# Patient Record
Sex: Female | Born: 1935 | Race: White | Hispanic: No | Smoking: Former smoker
Health system: Southern US, Community
[De-identification: ages and names within clinical notes are randomized; demographics above are authoritative.]

## PROBLEM LIST (undated history)

## (undated) DIAGNOSIS — R7302 Impaired glucose tolerance (oral): Secondary | ICD-10-CM

## (undated) DIAGNOSIS — I5030 Unspecified diastolic (congestive) heart failure: Secondary | ICD-10-CM

## (undated) DIAGNOSIS — F39 Unspecified mood [affective] disorder: Secondary | ICD-10-CM

## (undated) DIAGNOSIS — M1990 Unspecified osteoarthritis, unspecified site: Secondary | ICD-10-CM

## (undated) DIAGNOSIS — R259 Unspecified abnormal involuntary movements: Secondary | ICD-10-CM

## (undated) DIAGNOSIS — I2721 Secondary pulmonary arterial hypertension: Secondary | ICD-10-CM

## (undated) DIAGNOSIS — E785 Hyperlipidemia, unspecified: Secondary | ICD-10-CM

## (undated) DIAGNOSIS — I10 Essential (primary) hypertension: Secondary | ICD-10-CM

## (undated) DIAGNOSIS — J449 Chronic obstructive pulmonary disease, unspecified: Secondary | ICD-10-CM

## (undated) DIAGNOSIS — E039 Hypothyroidism, unspecified: Secondary | ICD-10-CM

## (undated) DIAGNOSIS — G252 Other specified forms of tremor: Secondary | ICD-10-CM

## (undated) DIAGNOSIS — M2550 Pain in unspecified joint: Secondary | ICD-10-CM

## (undated) HISTORY — PX: TONSILLECTOMY: SUR1361

---

## 2012-01-14 DIAGNOSIS — Z79899 Other long term (current) drug therapy: Secondary | ICD-10-CM | POA: Insufficient documentation

## 2012-01-14 DIAGNOSIS — Z0000 Encounter for general adult medical examination without abnormal findings: Secondary | ICD-10-CM | POA: Insufficient documentation

## 2012-01-14 DIAGNOSIS — R350 Frequency of micturition: Secondary | ICD-10-CM | POA: Insufficient documentation

## 2016-03-25 DIAGNOSIS — M170 Bilateral primary osteoarthritis of knee: Secondary | ICD-10-CM | POA: Insufficient documentation

## 2016-07-30 DIAGNOSIS — J9691 Respiratory failure, unspecified with hypoxia: Secondary | ICD-10-CM | POA: Insufficient documentation

## 2017-02-04 DIAGNOSIS — I509 Heart failure, unspecified: Secondary | ICD-10-CM | POA: Insufficient documentation

## 2017-02-04 DIAGNOSIS — M05741 Rheumatoid arthritis with rheumatoid factor of right hand without organ or systems involvement: Secondary | ICD-10-CM | POA: Insufficient documentation

## 2017-02-04 DIAGNOSIS — R0602 Shortness of breath: Secondary | ICD-10-CM | POA: Insufficient documentation

## 2017-02-04 DIAGNOSIS — M05742 Rheumatoid arthritis with rheumatoid factor of left hand without organ or systems involvement: Secondary | ICD-10-CM

## 2017-10-22 ENCOUNTER — Encounter: Payer: Self-pay | Admitting: Emergency Medicine

## 2017-10-22 ENCOUNTER — Emergency Department: Payer: Medicare Other

## 2017-10-22 ENCOUNTER — Inpatient Hospital Stay
Admission: EM | Admit: 2017-10-22 | Discharge: 2017-10-27 | DRG: 480 | Disposition: A | Discharge status: Skilled Nursing Facility | Payer: Medicare Other | Attending: Internal Medicine | Admitting: Internal Medicine

## 2017-10-22 DIAGNOSIS — I110 Hypertensive heart disease with heart failure: Secondary | ICD-10-CM | POA: Diagnosis present

## 2017-10-22 DIAGNOSIS — E860 Dehydration: Secondary | ICD-10-CM | POA: Diagnosis present

## 2017-10-22 DIAGNOSIS — E039 Hypothyroidism, unspecified: Secondary | ICD-10-CM | POA: Diagnosis present

## 2017-10-22 DIAGNOSIS — E785 Hyperlipidemia, unspecified: Secondary | ICD-10-CM | POA: Diagnosis present

## 2017-10-22 DIAGNOSIS — I5032 Chronic diastolic (congestive) heart failure: Secondary | ICD-10-CM | POA: Diagnosis present

## 2017-10-22 DIAGNOSIS — S72142A Displaced intertrochanteric fracture of left femur, initial encounter for closed fracture: Secondary | ICD-10-CM | POA: Diagnosis not present

## 2017-10-22 DIAGNOSIS — J9621 Acute and chronic respiratory failure with hypoxia: Secondary | ICD-10-CM | POA: Diagnosis present

## 2017-10-22 DIAGNOSIS — Z79890 Hormone replacement therapy (postmenopausal): Secondary | ICD-10-CM

## 2017-10-22 DIAGNOSIS — E43 Unspecified severe protein-calorie malnutrition: Secondary | ICD-10-CM

## 2017-10-22 DIAGNOSIS — R5381 Other malaise: Secondary | ICD-10-CM | POA: Diagnosis present

## 2017-10-22 DIAGNOSIS — K219 Gastro-esophageal reflux disease without esophagitis: Secondary | ICD-10-CM | POA: Diagnosis present

## 2017-10-22 DIAGNOSIS — Z87891 Personal history of nicotine dependence: Secondary | ICD-10-CM

## 2017-10-22 DIAGNOSIS — I2721 Secondary pulmonary arterial hypertension: Secondary | ICD-10-CM | POA: Diagnosis present

## 2017-10-22 DIAGNOSIS — Z791 Long term (current) use of non-steroidal anti-inflammatories (NSAID): Secondary | ICD-10-CM

## 2017-10-22 DIAGNOSIS — Z885 Allergy status to narcotic agent status: Secondary | ICD-10-CM

## 2017-10-22 DIAGNOSIS — S72002A Fracture of unspecified part of neck of left femur, initial encounter for closed fracture: Secondary | ICD-10-CM

## 2017-10-22 DIAGNOSIS — K5900 Constipation, unspecified: Secondary | ICD-10-CM | POA: Diagnosis present

## 2017-10-22 DIAGNOSIS — E876 Hypokalemia: Secondary | ICD-10-CM | POA: Diagnosis not present

## 2017-10-22 DIAGNOSIS — Z681 Body mass index (BMI) 19 or less, adult: Secondary | ICD-10-CM

## 2017-10-22 DIAGNOSIS — Z79899 Other long term (current) drug therapy: Secondary | ICD-10-CM

## 2017-10-22 DIAGNOSIS — J441 Chronic obstructive pulmonary disease with (acute) exacerbation: Secondary | ICD-10-CM | POA: Diagnosis present

## 2017-10-22 DIAGNOSIS — I493 Ventricular premature depolarization: Secondary | ICD-10-CM | POA: Diagnosis present

## 2017-10-22 DIAGNOSIS — W1811XA Fall from or off toilet without subsequent striking against object, initial encounter: Secondary | ICD-10-CM | POA: Diagnosis present

## 2017-10-22 DIAGNOSIS — M1990 Unspecified osteoarthritis, unspecified site: Secondary | ICD-10-CM | POA: Diagnosis present

## 2017-10-22 DIAGNOSIS — Z8249 Family history of ischemic heart disease and other diseases of the circulatory system: Secondary | ICD-10-CM

## 2017-10-22 DIAGNOSIS — Z9981 Dependence on supplemental oxygen: Secondary | ICD-10-CM

## 2017-10-22 DIAGNOSIS — Z419 Encounter for procedure for purposes other than remedying health state, unspecified: Secondary | ICD-10-CM

## 2017-10-22 DIAGNOSIS — R251 Tremor, unspecified: Secondary | ICD-10-CM | POA: Diagnosis present

## 2017-10-22 DIAGNOSIS — M810 Age-related osteoporosis without current pathological fracture: Secondary | ICD-10-CM | POA: Diagnosis present

## 2017-10-22 DIAGNOSIS — R739 Hyperglycemia, unspecified: Secondary | ICD-10-CM | POA: Diagnosis present

## 2017-10-22 DIAGNOSIS — J9600 Acute respiratory failure, unspecified whether with hypoxia or hypercapnia: Secondary | ICD-10-CM

## 2017-10-22 DIAGNOSIS — M069 Rheumatoid arthritis, unspecified: Secondary | ICD-10-CM | POA: Diagnosis present

## 2017-10-22 DIAGNOSIS — F419 Anxiety disorder, unspecified: Secondary | ICD-10-CM | POA: Diagnosis present

## 2017-10-22 DIAGNOSIS — I071 Rheumatic tricuspid insufficiency: Secondary | ICD-10-CM | POA: Diagnosis present

## 2017-10-22 HISTORY — DX: Chronic obstructive pulmonary disease, unspecified: J44.9

## 2017-10-22 HISTORY — DX: Other specified forms of tremor: G25.2

## 2017-10-22 HISTORY — DX: Impaired glucose tolerance (oral): R73.02

## 2017-10-22 HISTORY — DX: Unspecified abnormal involuntary movements: R25.9

## 2017-10-22 HISTORY — DX: Essential (primary) hypertension: I10

## 2017-10-22 HISTORY — DX: Pain in unspecified joint: M25.50

## 2017-10-22 HISTORY — DX: Unspecified diastolic (congestive) heart failure: I50.30

## 2017-10-22 HISTORY — DX: Unspecified mood (affective) disorder: F39

## 2017-10-22 HISTORY — DX: Hypothyroidism, unspecified: E03.9

## 2017-10-22 HISTORY — DX: Secondary pulmonary arterial hypertension: I27.21

## 2017-10-22 HISTORY — DX: Unspecified osteoarthritis, unspecified site: M19.90

## 2017-10-22 HISTORY — DX: Hyperlipidemia, unspecified: E78.5

## 2017-10-22 LAB — BASIC METABOLIC PANEL
Anion gap: 11 (ref 5–15)
BUN: 28 mg/dL — AB (ref 6–20)
CALCIUM: 9.5 mg/dL (ref 8.9–10.3)
CO2: 25 mmol/L (ref 22–32)
Chloride: 101 mmol/L (ref 101–111)
Creatinine, Ser: 0.61 mg/dL (ref 0.44–1.00)
GFR calc Af Amer: >60 mL/min (ref >60)
GLUCOSE: 119 mg/dL — AB (ref 65–99)
Potassium: 3.8 mmol/L (ref 3.5–5.1)
Sodium: 137 mmol/L (ref 135–145)

## 2017-10-22 LAB — CBC
HEMATOCRIT: 43.4 % (ref 35.0–47.0)
Hemoglobin: 14.3 g/dL (ref 12.0–16.0)
MCH: 31.2 pg (ref 26.0–34.0)
MCHC: 33 g/dL (ref 32.0–36.0)
MCV: 94.5 fL (ref 80.0–100.0)
Platelets: 250 10*3/uL (ref 150–440)
RBC: 4.59 MIL/uL (ref 3.80–5.20)
RDW: 13.9 % (ref 11.5–14.5)
WBC: 9.3 10*3/uL (ref 3.6–11.0)

## 2017-10-22 LAB — TYPE AND SCREEN
ABO/RH(D): O POS
Antibody Screen: NEGATIVE

## 2017-10-22 MED ORDER — MORPHINE SULFATE (PF) 4 MG/ML IV SOLN
INTRAVENOUS | Status: AC
  Administered 2017-10-22: 4 mg
  Filled 2017-10-22: qty 1

## 2017-10-22 MED ORDER — ONDANSETRON HCL 4 MG/2ML IJ SOLN
INTRAMUSCULAR | Status: AC
  Administered 2017-10-22: 4 mg
  Filled 2017-10-22: qty 2

## 2017-10-22 MED ORDER — CEFAZOLIN SODIUM-DEXTROSE 2-4 GM/100ML-% IV SOLN
2.0000 g | INTRAVENOUS | Status: AC
  Administered 2017-10-23: 2 g via INTRAVENOUS
  Filled 2017-10-22 (×2): qty 100

## 2017-10-22 NOTE — ED Notes (Signed)
Patient placed on purewick external catheter by this EDT.

## 2017-10-22 NOTE — ED Provider Notes (Signed)
[facility] Emergency Department Provider Note ____________________________________________   First MD Initiated Contact with Patient 10/22/17 2139     (approximate)  I have reviewed the triage vital signs and the nursing notes.   HISTORY  Chief Complaint Fall    HPI Gina Saunders is a 83 y.o. female with PMH as noted below who presents with left hip pain, acute onset earlier this evening when the patient fell while using the commode.  Patient states that she might have hit her head but did not have LOC and has no head or neck pain at this time.  She denies any other associated injuries.   Past Medical History:  Diagnosis Date  . CHF (congestive heart failure) (HCC)   . COPD (chronic obstructive pulmonary disease) (HCC)   . Hypertension     There are no active problems to display for this patient.   Past Surgical History:  Procedure Laterality Date  . TONSILLECTOMY      Prior to Admission medications   Not on File    Allergies Codeine  No family history on file.  Social History Social History   Tobacco Use  . Smoking status: Former Games developer  . Smokeless tobacco: Never Used  Substance Use Topics  . Alcohol use: Never    Frequency: Never  . Drug use: Never    Review of Systems  Constitutional: No fever. Eyes: No redness. ENT: No neck pain. Cardiovascular: Denies chest pain. Respiratory: Denies shortness of breath. Gastrointestinal: No abdominal pain.  Genitourinary: Negative for flank pain.  Musculoskeletal: Positive for left hip pain. Skin: Negative for abrasions or lacerations. Neurological: Negative for headache.   ____________________________________________   PHYSICAL EXAM:  VITAL SIGNS: ED Triage Vitals  Enc Vitals Group     BP 10/22/17 2117 (!) 195/128     Pulse Rate 10/22/17 2117 96     Resp 10/22/17 2117 20     Temp 10/22/17 2117 97.6 F (36.4 C)     Temp Source 10/22/17 2117 Oral     SpO2 10/22/17  2117 94 %     Weight 10/22/17 2107 114 lb (51.7 kg)     Height 10/22/17 2107 5\' 5"  (1.651 m)     Head Circumference --      Peak Flow --      Pain Score 10/22/17 2106 9     Pain Loc --      Pain Edu? --      Excl. in GC? --     Constitutional: Alert and oriented. Well appearing an somewhat frail appearing but in no acute distress. Eyes: Conjunctivae are normal.  EOMI. Head: Atraumatic. Nose: No congestion/rhinnorhea. Mouth/Throat: Mucous membranes are moist.   Neck: Normal range of motion.  Cardiovascular: Normal rate, regular rhythm. Grossly normal heart sounds.  Good peripheral circulation. Respiratory: Normal respiratory effort.  No retractions. Lungs CTAB. Gastrointestinal: Soft and nontender. No distention.  Genitourinary: No flank tenderness. Musculoskeletal: Shortening of left hip with pain on R OM. Extremities warm and well perfused.  Neurologic:  Normal speech and language. No gross focal neurologic deficits are appreciated.  Skin:  Skin is warm and dry. No rash noted. Psychiatric: Mood and affect are normal. Speech and behavior are normal.  ____________________________________________   LABS (all labs ordered are listed, but only abnormal results are displayed)  Labs Reviewed  BASIC METABOLIC PANEL - Abnormal; Notable for the following components:      Result Value   Glucose, Bld 119 (*)  BUN 28 (*)    All other components within normal limits  CBC  TYPE AND SCREEN   ____________________________________________  EKG   ____________________________________________  RADIOLOGY  CXR: Diffuse interstitial prominence L hip XR: Intertrochanteric fracture  ____________________________________________   PROCEDURES  Procedure(s) performed: No  Procedures  Critical Care performed: No ____________________________________________   INITIAL IMPRESSION / ASSESSMENT AND PLAN / ED COURSE  Pertinent labs & imaging results that were available during my care  of the patient were reviewed by me and considered in my medical decision making (see chart for details).  83 year old female with PMH as noted above presents with left hip pain and deformity after a mechanical fall from the commode.  She denies other injuries.  The left lower extremity is neuro/vascular intact.  No other evidence of injury on exam.    X-rays confirmed left hip fracture.  I consulted Dr. Joice Lofts from orthopedics who will plan to operate on the patient.  I admitted the patient to the hospitalist Dr. Caryn Bee.  The patient has a history of COPD and CHF.  She has borderline hypoxia but no acute respiratory distress.  X-ray shows mild interstitial edema, so the patient will likely need some diuresis.  She has no chest pain, respiratory distress, or evidence of acute CHF.      ____________________________________________   FINAL CLINICAL IMPRESSION(S) / ED DIAGNOSES  Final diagnoses:  Closed fracture of left hip, initial encounter (HCC)      NEW MEDICATIONS STARTED DURING THIS VISIT:  New Prescriptions   No medications on file     Note:  This document was prepared using Dragon voice recognition software and may include unintentional dictation errors.   Dionne Bucy, MD 10/22/17 [phone]

## 2017-10-22 NOTE — ED Provider Notes (Signed)
EKG read and interpreted by me shows normal sinus rhythm at a rate of approximately 95 there are frequent PVCs I am unable to determine the axis because of the large amount of artifact present I will have to get another EKG.   Arnaldo Natal, MD 10/23/17 [phone]

## 2017-10-22 NOTE — ED Triage Notes (Signed)
Pt comes into the ED via [facility] from [facility] c/o mechanical fall.  Patient has noted shortening and rotation to the left leg.  Patient states that she tripped in the room.  Patient was Hypertensive and tachycardic for EMS.  Patient has even and unlabored respirations at this time and is wearing her chronic 4L.

## 2017-10-23 ENCOUNTER — Inpatient Hospital Stay: Payer: Medicare Other | Admitting: Anesthesiology

## 2017-10-23 ENCOUNTER — Inpatient Hospital Stay: Payer: Medicare Other

## 2017-10-23 ENCOUNTER — Encounter
Admission: EM | Disposition: A | Discharge status: Skilled Nursing Facility | Payer: Self-pay | Source: Home / Self Care | Attending: Internal Medicine

## 2017-10-23 ENCOUNTER — Encounter: Payer: Self-pay | Admitting: Internal Medicine

## 2017-10-23 ENCOUNTER — Other Ambulatory Visit: Payer: Self-pay

## 2017-10-23 ENCOUNTER — Inpatient Hospital Stay (HOSPITAL_COMMUNITY)
Admit: 2017-10-23 | Discharge: 2017-10-23 | Disposition: A | Discharge status: Home / Self Care | Payer: Medicare Other | Attending: Nurse Practitioner | Admitting: Nurse Practitioner

## 2017-10-23 DIAGNOSIS — I110 Hypertensive heart disease with heart failure: Secondary | ICD-10-CM | POA: Diagnosis present

## 2017-10-23 DIAGNOSIS — I340 Nonrheumatic mitral (valve) insufficiency: Secondary | ICD-10-CM

## 2017-10-23 DIAGNOSIS — Z681 Body mass index (BMI) 19 or less, adult: Secondary | ICD-10-CM | POA: Diagnosis not present

## 2017-10-23 DIAGNOSIS — I272 Pulmonary hypertension, unspecified: Secondary | ICD-10-CM | POA: Diagnosis not present

## 2017-10-23 DIAGNOSIS — E559 Vitamin D deficiency, unspecified: Secondary | ICD-10-CM | POA: Insufficient documentation

## 2017-10-23 DIAGNOSIS — E860 Dehydration: Secondary | ICD-10-CM | POA: Diagnosis present

## 2017-10-23 DIAGNOSIS — J449 Chronic obstructive pulmonary disease, unspecified: Secondary | ICD-10-CM | POA: Insufficient documentation

## 2017-10-23 DIAGNOSIS — J432 Centrilobular emphysema: Secondary | ICD-10-CM

## 2017-10-23 DIAGNOSIS — Z9981 Dependence on supplemental oxygen: Secondary | ICD-10-CM | POA: Diagnosis not present

## 2017-10-23 DIAGNOSIS — M1990 Unspecified osteoarthritis, unspecified site: Secondary | ICD-10-CM | POA: Insufficient documentation

## 2017-10-23 DIAGNOSIS — J9621 Acute and chronic respiratory failure with hypoxia: Secondary | ICD-10-CM | POA: Diagnosis present

## 2017-10-23 DIAGNOSIS — I270 Primary pulmonary hypertension: Secondary | ICD-10-CM | POA: Diagnosis not present

## 2017-10-23 DIAGNOSIS — J9602 Acute respiratory failure with hypercapnia: Secondary | ICD-10-CM | POA: Diagnosis not present

## 2017-10-23 DIAGNOSIS — R251 Tremor, unspecified: Secondary | ICD-10-CM | POA: Diagnosis present

## 2017-10-23 DIAGNOSIS — S72002A Fracture of unspecified part of neck of left femur, initial encounter for closed fracture: Secondary | ICD-10-CM

## 2017-10-23 DIAGNOSIS — Z01810 Encounter for preprocedural cardiovascular examination: Secondary | ICD-10-CM | POA: Diagnosis not present

## 2017-10-23 DIAGNOSIS — J9601 Acute respiratory failure with hypoxia: Secondary | ICD-10-CM | POA: Diagnosis not present

## 2017-10-23 DIAGNOSIS — Z79899 Other long term (current) drug therapy: Secondary | ICD-10-CM | POA: Diagnosis not present

## 2017-10-23 DIAGNOSIS — J9611 Chronic respiratory failure with hypoxia: Secondary | ICD-10-CM | POA: Diagnosis not present

## 2017-10-23 DIAGNOSIS — E039 Hypothyroidism, unspecified: Secondary | ICD-10-CM | POA: Insufficient documentation

## 2017-10-23 DIAGNOSIS — J441 Chronic obstructive pulmonary disease with (acute) exacerbation: Secondary | ICD-10-CM | POA: Diagnosis present

## 2017-10-23 DIAGNOSIS — Z87891 Personal history of nicotine dependence: Secondary | ICD-10-CM | POA: Diagnosis not present

## 2017-10-23 DIAGNOSIS — R0603 Acute respiratory distress: Secondary | ICD-10-CM | POA: Diagnosis not present

## 2017-10-23 DIAGNOSIS — K219 Gastro-esophageal reflux disease without esophagitis: Secondary | ICD-10-CM | POA: Insufficient documentation

## 2017-10-23 DIAGNOSIS — I10 Essential (primary) hypertension: Secondary | ICD-10-CM | POA: Insufficient documentation

## 2017-10-23 DIAGNOSIS — S72142A Displaced intertrochanteric fracture of left femur, initial encounter for closed fracture: Secondary | ICD-10-CM | POA: Diagnosis present

## 2017-10-23 DIAGNOSIS — E43 Unspecified severe protein-calorie malnutrition: Secondary | ICD-10-CM | POA: Diagnosis present

## 2017-10-23 DIAGNOSIS — I2721 Secondary pulmonary arterial hypertension: Secondary | ICD-10-CM | POA: Diagnosis present

## 2017-10-23 DIAGNOSIS — Z791 Long term (current) use of non-steroidal anti-inflammatories (NSAID): Secondary | ICD-10-CM | POA: Diagnosis not present

## 2017-10-23 DIAGNOSIS — W1811XA Fall from or off toilet without subsequent striking against object, initial encounter: Secondary | ICD-10-CM | POA: Diagnosis present

## 2017-10-23 DIAGNOSIS — M810 Age-related osteoporosis without current pathological fracture: Secondary | ICD-10-CM | POA: Diagnosis present

## 2017-10-23 DIAGNOSIS — R0902 Hypoxemia: Secondary | ICD-10-CM | POA: Diagnosis not present

## 2017-10-23 DIAGNOSIS — Z885 Allergy status to narcotic agent status: Secondary | ICD-10-CM | POA: Diagnosis not present

## 2017-10-23 DIAGNOSIS — R739 Hyperglycemia, unspecified: Secondary | ICD-10-CM | POA: Diagnosis present

## 2017-10-23 DIAGNOSIS — I5032 Chronic diastolic (congestive) heart failure: Secondary | ICD-10-CM | POA: Diagnosis present

## 2017-10-23 DIAGNOSIS — K5900 Constipation, unspecified: Secondary | ICD-10-CM | POA: Diagnosis present

## 2017-10-23 DIAGNOSIS — E785 Hyperlipidemia, unspecified: Secondary | ICD-10-CM | POA: Diagnosis present

## 2017-10-23 DIAGNOSIS — E876 Hypokalemia: Secondary | ICD-10-CM | POA: Diagnosis not present

## 2017-10-23 DIAGNOSIS — Z79890 Hormone replacement therapy (postmenopausal): Secondary | ICD-10-CM | POA: Diagnosis not present

## 2017-10-23 HISTORY — PX: INTRAMEDULLARY (IM) NAIL INTERTROCHANTERIC: SHX5875

## 2017-10-23 LAB — SURGICAL PCR SCREEN
MRSA, PCR: NEGATIVE
Staphylococcus aureus: NEGATIVE

## 2017-10-23 LAB — PROTIME-INR
INR: 0.98
PROTHROMBIN TIME: 12.9 s (ref 11.4–15.2)

## 2017-10-23 LAB — APTT: aPTT: 31 seconds (ref 24–36)

## 2017-10-23 LAB — ECHOCARDIOGRAM COMPLETE
HEIGHTINCHES: 65 in
Weight: 1824 oz

## 2017-10-23 SURGERY — FIXATION, FRACTURE, INTERTROCHANTERIC, WITH INTRAMEDULLARY ROD
Anesthesia: Spinal | Site: Hip | Laterality: Left | Wound class: Clean

## 2017-10-23 MED ORDER — DEXMEDETOMIDINE HCL IN NACL 200 MCG/50ML IV SOLN
INTRAVENOUS | Status: AC
  Filled 2017-10-23: qty 50

## 2017-10-23 MED ORDER — METOCLOPRAMIDE HCL 5 MG/ML IJ SOLN
5.0000 mg | Freq: Three times a day (TID) | INTRAMUSCULAR | Status: DC | PRN
  Administered 2017-10-23: 10 mg via INTRAVENOUS
  Filled 2017-10-23: qty 2

## 2017-10-23 MED ORDER — OXYCODONE HCL 5 MG PO TABS
5.0000 mg | ORAL_TABLET | ORAL | Status: DC | PRN
  Administered 2017-10-24: 5 mg via ORAL
  Filled 2017-10-23: qty 1

## 2017-10-23 MED ORDER — PROPOFOL 500 MG/50ML IV EMUL
INTRAVENOUS | Status: AC
  Filled 2017-10-23: qty 50

## 2017-10-23 MED ORDER — SENNOSIDES-DOCUSATE SODIUM 8.6-50 MG PO TABS
1.0000 | ORAL_TABLET | Freq: Every evening | ORAL | Status: DC | PRN
  Administered 2017-10-26: 1 via ORAL
  Filled 2017-10-23: qty 1

## 2017-10-23 MED ORDER — FLEET ENEMA 7-19 GM/118ML RE ENEM: 1.0000 | ENEMA | Freq: Once | RECTAL | Status: DC | PRN

## 2017-10-23 MED ORDER — ONDANSETRON HCL 4 MG PO TABS
4.0000 mg | ORAL_TABLET | Freq: Four times a day (QID) | ORAL | Status: DC | PRN
  Administered 2017-10-27: 4 mg via ORAL
  Filled 2017-10-23: qty 1

## 2017-10-23 MED ORDER — NEOMYCIN-POLYMYXIN B GU 40-200000 IR SOLN
Status: DC | PRN
  Administered 2017-10-23: 2 mL

## 2017-10-23 MED ORDER — HYDROMORPHONE HCL 1 MG/ML IJ SOLN: 0.5000 mg | INTRAMUSCULAR | Status: DC | PRN

## 2017-10-23 MED ORDER — FENTANYL CITRATE (PF) 100 MCG/2ML IJ SOLN: 25.0000 ug | INTRAMUSCULAR | Status: DC | PRN

## 2017-10-23 MED ORDER — LACTATED RINGERS IV SOLN
INTRAVENOUS | Status: DC | PRN
  Administered 2017-10-23: 16:00:00 via INTRAVENOUS

## 2017-10-23 MED ORDER — ONDANSETRON HCL 4 MG/2ML IJ SOLN
4.0000 mg | Freq: Four times a day (QID) | INTRAMUSCULAR | Status: DC | PRN
  Administered 2017-10-23 (×3): 4 mg via INTRAVENOUS
  Filled 2017-10-23 (×2): qty 2

## 2017-10-23 MED ORDER — ACETAMINOPHEN 500 MG PO TABS
1000.0000 mg | ORAL_TABLET | Freq: Four times a day (QID) | ORAL | Status: AC
  Administered 2017-10-23 – 2017-10-24 (×3): 1000 mg via ORAL
  Filled 2017-10-23 (×3): qty 2

## 2017-10-23 MED ORDER — HYDROMORPHONE HCL 1 MG/ML IJ SOLN
1.0000 mg | INTRAMUSCULAR | Status: DC | PRN
  Administered 2017-10-23 (×2): 1 mg via INTRAVENOUS
  Filled 2017-10-23: qty 1

## 2017-10-23 MED ORDER — ONDANSETRON HCL 4 MG/2ML IJ SOLN
4.0000 mg | Freq: Four times a day (QID) | INTRAMUSCULAR | Status: DC | PRN
Start: 2017-10-23 — End: 2017-10-27
  Administered 2017-10-23 – 2017-10-26 (×3): 4 mg via INTRAVENOUS
  Filled 2017-10-23 (×3): qty 2

## 2017-10-23 MED ORDER — ACETAMINOPHEN 325 MG PO TABS: 650.0000 mg | ORAL_TABLET | Freq: Four times a day (QID) | ORAL | Status: DC | PRN

## 2017-10-23 MED ORDER — PROPRANOLOL HCL 20 MG PO TABS
20.0000 mg | ORAL_TABLET | Freq: Two times a day (BID) | ORAL | Status: DC
  Administered 2017-10-23 – 2017-10-27 (×8): 20 mg via ORAL
  Filled 2017-10-23 (×9): qty 1

## 2017-10-23 MED ORDER — SERTRALINE HCL 50 MG PO TABS
100.0000 mg | ORAL_TABLET | Freq: Every day | ORAL | Status: DC
  Administered 2017-10-24 – 2017-10-27 (×4): 100 mg via ORAL
  Filled 2017-10-23 (×4): qty 2

## 2017-10-23 MED ORDER — ENOXAPARIN SODIUM 40 MG/0.4ML ~~LOC~~ SOLN
40.0000 mg | SUBCUTANEOUS | Status: DC
  Administered 2017-10-24 – 2017-10-27 (×4): 40 mg via SUBCUTANEOUS
  Filled 2017-10-23 (×4): qty 0.4

## 2017-10-23 MED ORDER — SIMVASTATIN 20 MG PO TABS
20.0000 mg | ORAL_TABLET | Freq: Every day | ORAL | Status: DC
  Administered 2017-10-23 – 2017-10-26 (×4): 20 mg via ORAL
  Filled 2017-10-23 (×4): qty 1

## 2017-10-23 MED ORDER — EPHEDRINE SULFATE 50 MG/ML IJ SOLN
INTRAMUSCULAR | Status: DC | PRN
  Administered 2017-10-23 (×4): 5 mg via INTRAVENOUS

## 2017-10-23 MED ORDER — LACTATED RINGERS IV SOLN
INTRAVENOUS | Status: DC
  Administered 2017-10-23: 02:00:00 via INTRAVENOUS

## 2017-10-23 MED ORDER — BISACODYL 10 MG RE SUPP: 10.0000 mg | Freq: Every day | RECTAL | Status: DC | PRN

## 2017-10-23 MED ORDER — BUPIVACAINE HCL (PF) 0.5 % IJ SOLN
INTRAMUSCULAR | Status: DC | PRN
  Administered 2017-10-23: 2.5 mL

## 2017-10-23 MED ORDER — SILDENAFIL CITRATE 20 MG PO TABS
20.0000 mg | ORAL_TABLET | Freq: Two times a day (BID) | ORAL | Status: DC
  Administered 2017-10-23 – 2017-10-27 (×8): 20 mg via ORAL
  Filled 2017-10-23 (×11): qty 1

## 2017-10-23 MED ORDER — DOCUSATE SODIUM 100 MG PO CAPS
100.0000 mg | ORAL_CAPSULE | Freq: Two times a day (BID) | ORAL | Status: DC
  Administered 2017-10-23 – 2017-10-26 (×7): 100 mg via ORAL
  Filled 2017-10-23 (×7): qty 1

## 2017-10-23 MED ORDER — BISACODYL 5 MG PO TBEC
5.0000 mg | DELAYED_RELEASE_TABLET | Freq: Every day | ORAL | Status: DC | PRN
  Administered 2017-10-25: 5 mg via ORAL
  Filled 2017-10-23 (×2): qty 1

## 2017-10-23 MED ORDER — METOCLOPRAMIDE HCL 10 MG PO TABS: 5.0000 mg | ORAL_TABLET | Freq: Three times a day (TID) | ORAL | Status: DC | PRN

## 2017-10-23 MED ORDER — BUPIVACAINE-EPINEPHRINE (PF) 0.5% -1:200000 IJ SOLN
INTRAMUSCULAR | Status: DC | PRN
  Administered 2017-10-23: 30 mL via PERINEURAL

## 2017-10-23 MED ORDER — ACETAMINOPHEN 325 MG PO TABS
325.0000 mg | ORAL_TABLET | Freq: Four times a day (QID) | ORAL | Status: DC | PRN
  Administered 2017-10-24 – 2017-10-27 (×6): 650 mg via ORAL
  Filled 2017-10-23 (×6): qty 2

## 2017-10-23 MED ORDER — DIPHENHYDRAMINE HCL 12.5 MG/5ML PO ELIX: 12.5000 mg | ORAL_SOLUTION | ORAL | Status: DC | PRN

## 2017-10-23 MED ORDER — FAMOTIDINE 20 MG PO TABS
20.0000 mg | ORAL_TABLET | Freq: Every day | ORAL | Status: DC
Start: 2017-10-23 — End: 2017-10-27
  Administered 2017-10-24 – 2017-10-27 (×4): 20 mg via ORAL
  Filled 2017-10-23 (×4): qty 1

## 2017-10-23 MED ORDER — MIDAZOLAM HCL 2 MG/2ML IJ SOLN
INTRAMUSCULAR | Status: AC
  Filled 2017-10-23: qty 2

## 2017-10-23 MED ORDER — SODIUM CHLORIDE 0.9 % IV SOLN
INTRAVENOUS | Status: DC
  Administered 2017-10-23: 20:00:00 via INTRAVENOUS

## 2017-10-23 MED ORDER — MAGNESIUM HYDROXIDE 400 MG/5ML PO SUSP
30.0000 mL | Freq: Every day | ORAL | Status: DC | PRN
  Administered 2017-10-26: 30 mL via ORAL
  Filled 2017-10-23: qty 30

## 2017-10-23 MED ORDER — DEXMEDETOMIDINE HCL IN NACL 200 MCG/50ML IV SOLN
INTRAVENOUS | Status: DC | PRN
  Administered 2017-10-23 (×2): 4 ug via INTRAVENOUS

## 2017-10-23 MED ORDER — TRAMADOL HCL 50 MG PO TABS
50.0000 mg | ORAL_TABLET | Freq: Four times a day (QID) | ORAL | Status: DC | PRN
  Administered 2017-10-24 (×2): 50 mg via ORAL
  Filled 2017-10-23 (×2): qty 1

## 2017-10-23 MED ORDER — LISINOPRIL 20 MG PO TABS
40.0000 mg | ORAL_TABLET | Freq: Every day | ORAL | Status: DC
  Administered 2017-10-24 – 2017-10-27 (×4): 40 mg via ORAL
  Filled 2017-10-23 (×4): qty 2

## 2017-10-23 MED ORDER — CEFAZOLIN SODIUM-DEXTROSE 2-4 GM/100ML-% IV SOLN
2.0000 g | Freq: Four times a day (QID) | INTRAVENOUS | Status: AC
  Administered 2017-10-23 – 2017-10-24 (×3): 2 g via INTRAVENOUS
  Filled 2017-10-23 (×3): qty 100

## 2017-10-23 MED ORDER — PHENYLEPHRINE HCL 10 MG/ML IJ SOLN
INTRAMUSCULAR | Status: DC | PRN
  Administered 2017-10-23 (×2): 50 ug via INTRAVENOUS

## 2017-10-23 MED ORDER — IPRATROPIUM-ALBUTEROL 0.5-2.5 (3) MG/3ML IN SOLN: 3.0000 mL | Freq: Four times a day (QID) | RESPIRATORY_TRACT | Status: DC | PRN

## 2017-10-23 MED ORDER — HYDROMORPHONE HCL 1 MG/ML IJ SOLN
INTRAMUSCULAR | Status: AC
  Administered 2017-10-23: 1 mg via INTRAVENOUS
  Filled 2017-10-23: qty 1

## 2017-10-23 MED ORDER — LEVOTHYROXINE SODIUM 88 MCG PO TABS
88.0000 ug | ORAL_TABLET | Freq: Every day | ORAL | Status: DC
  Administered 2017-10-25 – 2017-10-27 (×3): 88 ug via ORAL
  Filled 2017-10-23 (×5): qty 1

## 2017-10-23 MED ORDER — MIDAZOLAM HCL 5 MG/5ML IJ SOLN
INTRAMUSCULAR | Status: DC | PRN
  Administered 2017-10-23: 2 mg via INTRAVENOUS

## 2017-10-23 MED ORDER — PROPOFOL 500 MG/50ML IV EMUL
INTRAVENOUS | Status: DC | PRN
  Administered 2017-10-23: 30 ug/kg/min via INTRAVENOUS

## 2017-10-23 MED ORDER — AMLODIPINE BESYLATE 5 MG PO TABS
5.0000 mg | ORAL_TABLET | Freq: Every day | ORAL | Status: DC
  Administered 2017-10-24 – 2017-10-27 (×4): 5 mg via ORAL
  Filled 2017-10-23 (×4): qty 1

## 2017-10-23 MED ORDER — EPHEDRINE SULFATE 50 MG/ML IJ SOLN
INTRAMUSCULAR | Status: AC
  Filled 2017-10-23: qty 1

## 2017-10-23 SURGICAL SUPPLY — 40 items
BIT DRILL 4.3MMS DISTAL GRDTED (BIT) ×1 IMPLANT
BNDG COHESIVE 4X5 TAN STRL (GAUZE/BANDAGES/DRESSINGS) ×3 IMPLANT
BNDG COHESIVE 6X5 TAN STRL LF (GAUZE/BANDAGES/DRESSINGS) ×3 IMPLANT
CANISTER SUCT 1200ML W/VALVE (MISCELLANEOUS) ×3 IMPLANT
CHLORAPREP W/TINT 26ML (MISCELLANEOUS) ×6 IMPLANT
DRAPE C-ARMOR (DRAPES) ×3 IMPLANT
DRAPE SHEET LG 3/4 BI-LAMINATE (DRAPES) ×3 IMPLANT
DRILL 4.3MMS DISTAL GRADUATED (BIT) ×3
DRSG OPSITE POSTOP 3X4 (GAUZE/BANDAGES/DRESSINGS) ×6 IMPLANT
DRSG OPSITE POSTOP 4X6 (GAUZE/BANDAGES/DRESSINGS) ×3 IMPLANT
ELECT CAUTERY BLADE 6.4 (BLADE) ×3 IMPLANT
ELECT REM PT RETURN 9FT ADLT (ELECTROSURGICAL) ×3
ELECTRODE REM PT RTRN 9FT ADLT (ELECTROSURGICAL) ×1 IMPLANT
GAUZE SPONGE 4X4 12PLY STRL (GAUZE/BANDAGES/DRESSINGS) ×3 IMPLANT
GLOVE BIO SURGEON STRL SZ8 (GLOVE) ×6 IMPLANT
GLOVE INDICATOR 8.0 STRL GRN (GLOVE) ×3 IMPLANT
GOWN STRL REUS W/ TWL LRG LVL3 (GOWN DISPOSABLE) ×1 IMPLANT
GOWN STRL REUS W/ TWL XL LVL3 (GOWN DISPOSABLE) ×1 IMPLANT
GOWN STRL REUS W/TWL LRG LVL3 (GOWN DISPOSABLE) ×2
GOWN STRL REUS W/TWL XL LVL3 (GOWN DISPOSABLE) ×2
GUIDEPIN VERSANAIL DSP 3.2X444 (ORTHOPEDIC DISPOSABLE SUPPLIES) ×3 IMPLANT
GUIDEWIRE BALL NOSE 100CM (WIRE) ×3 IMPLANT
HFN LAG SCREW 10.5MM X 85MM (Screw) ×3 IMPLANT
HFN LH 130 DEG 9MM X 360MM (Nail) ×3 IMPLANT
MAT BLUE FLOOR 46X72 FLO (MISCELLANEOUS) ×3 IMPLANT
NEEDLE FILTER BLUNT 18X 1/2SAF (NEEDLE) ×2
NEEDLE FILTER BLUNT 18X1 1/2 (NEEDLE) ×1 IMPLANT
NEEDLE HYPO 22GX1.5 SAFETY (NEEDLE) ×3 IMPLANT
NS IRRIG 500ML POUR BTL (IV SOLUTION) ×3 IMPLANT
PACK HIP COMPR (MISCELLANEOUS) ×3 IMPLANT
SCREW BONE CORTICAL 5.0X38 (Screw) ×3 IMPLANT
SCREWDRIVER HEX TIP 3.5MM (MISCELLANEOUS) ×3 IMPLANT
STAPLER SKIN PROX 35W (STAPLE) ×3 IMPLANT
STRAP SAFETY 5IN WIDE (MISCELLANEOUS) ×3 IMPLANT
SUT VIC AB 0 CT1 36 (SUTURE) ×3 IMPLANT
SUT VIC AB 1 CT1 36 (SUTURE) ×3 IMPLANT
SUT VIC AB 2-0 CT1 (SUTURE) ×6 IMPLANT
SYR 10ML LL (SYRINGE) ×3 IMPLANT
SYR 30ML LL (SYRINGE) ×3 IMPLANT
TAPE MICROFOAM 4IN (TAPE) ×3 IMPLANT

## 2017-10-23 NOTE — Progress Notes (Addendum)
Initial Nutrition Assessment  DOCUMENTATION CODES:   Severe malnutrition in context of chronic illness  INTERVENTION:   RD will order supplements when diet advanced; Recommend   Vital Cuisine TID, each supplement provides 520kcal and 22g of protein.  Magic cup TID with meals, each supplement provides 290 kcal and 9 grams of protein  Recommend daily MVI  Recommend Oscal with D supplementation to encourage bone healing   Recommend Liberal  diet   NUTRITION DIAGNOSIS:   Severe Malnutrition related to chronic illness(COPD, CHF) as evidenced by severe fat depletion, severe muscle depletion.  GOAL:   Patient will meet greater than or equal to 90% of their needs  MONITOR:   PO intake, Supplement acceptance, Labs, Weight trends, Skin, I & O's  REASON FOR ASSESSMENT:   Consult Hip fracture protocol  ASSESSMENT:   83 y.o. female with a known history of COPD (4L [place] home O2), pulmonary HTN (on Revatio), chronic diastolic CHF (per 77/41/4239 Echo), HTN, HLD, hypothyroidism, GERD, osteoporosis, vit D def, anx, DJD/OA, tremor, who p/w 1d Hx mechanical fall w/ L intertrochanteric hip fracture.    Met with pt and family in room today. Pt and family reports pt with decreased appetite and oral intake as she has gotten older and a slow decline in her weight over the past 6 months to one year. Pt avoids many foods in fear of getting "too much salt" as she has h/o CHF and hypertension. Pt denies any trouble chewing or swallowing. Pt does drink Boost supplements and takes a daily multivitamin at home. Pt does not like Ensure as she reports its too thin. Per chart, pt appears to be weight stable over the past year. Pt is currently NPO for scheduled surgery today. RD will order supplements when diet advanced. RD discussed with pt today regarding the importance of adequate calorie and protein intake needed for healing and to preserve lean muscle. Recommend Liberal diet and daily supplements at home as  pt with severe malnutrition. Pt is not likely eating enough to exceed sodium intakes. Recommend daily Oscal with D to support bone health and healing.   Medications reviewed and include: pepcid, synthroid, hydromorphone, zofran   Labs reviewed:   NUTRITION - FOCUSED PHYSICAL EXAM:    Most Recent Value  Orbital Region  Mild depletion  Upper Arm Region  Severe depletion  Thoracic and Lumbar Region  Moderate depletion  Buccal Region  Mild depletion  Temple Region  Mild depletion  Clavicle Bone Region  Mild depletion  Clavicle and Acromion Bone Region  Mild depletion  Scapular Bone Region  Mild depletion  Dorsal Hand  Severe depletion  Patellar Region  Severe depletion  Anterior Thigh Region  Severe depletion  Posterior Calf Region  Severe depletion  Edema (RD Assessment)  None  Hair  Reviewed  Eyes  Reviewed  Mouth  Reviewed  Skin  Reviewed  Nails  Reviewed     Diet Order:   Diet Order           Diet NPO time specified Except for: Ice Chips, Sips with Meds  Diet effective now         EDUCATION NEEDS:   Education needs have been addressed  Skin:  Skin Assessment: (ecchymosis )  Last BM:  5/9  Height:   Ht Readings from Last 1 Encounters:  10/22/17 5' 5"  (1.651 m)    Weight:   Wt Readings from Last 1 Encounters:  10/22/17 114 lb (51.7 kg)    Ideal Body  Weight:  56.81 kg  BMI:  Body mass index is 18.97 kg/m.  Estimated Nutritional Needs:   Kcal:  1300-1500 calories  Protein:  78-88 grams (1.5-1.7g/kg)  Fluid:  >1.3L/day   Koleen Distance MS, RD, LDN Pager #- [phone] Office#- [phone] After Hours Pager: [phone]

## 2017-10-23 NOTE — Progress Notes (Signed)
*  PRELIMINARY RESULTS* Echocardiogram 2D Echocardiogram has been performed.  Joanette Gula Jaylynne Birkhead 10/23/2017, 1:37 PM

## 2017-10-23 NOTE — Progress Notes (Signed)
The patient is lethargic and complains of generalized weakness. She is on 6 L oxygen.  She has home oxygen 4 L. Vital signs are stable, lab is unremarkable. Physical examination: Lung sounds clear, heart rate is normal.  Left leg deformity. Pain control, hip surgery today per Dr. Joice Lofts. The patient has chronic respiratory failure, COPD and CHF.  High risk for hip surgery and postoperative complications. Waiting for pulmonary consult and cardiology consult. Severe malnutrition.  Follow-up with dietitian consult. I discussed with Dr. Joice Lofts and Dr. Mariah Milling. I discussed with the patient and her daughter. Discussed with RN.  Time spent about 36 minutes.

## 2017-10-23 NOTE — Clinical Social Work Note (Signed)
Clinical Social Work Assessment  Patient Details  Name: Gina Saunders MRN: [id number] Date of Birth: June 06, 1935  Date of referral:  10/23/17               Reason for consult:  Facility Placement                Permission sought to share information with:  Chartered certified accountant granted to share information::  Yes, Verbal Permission Granted  Name::      [place]::   [place]   Relationship::     Contact Information:     Housing/Transportation Living arrangements for the past 2 months:  [place] of Information:  Patient, Adult Children Patient Interpreter Needed:  None Criminal Activity/Legal Involvement Pertinent to Current Situation/Hospitalization:  No - Comment as needed Significant Relationships:  Adult Children Lives with:  Self Do you feel safe going back to the place where you live?  Yes Need for family participation in patient care:  Yes (Comment)  Care giving concerns:  Patient lives alone at [facility] in [place].    Social Worker assessment / plan:  Holiday representative ([place]) reviewed chart and noted that patient has a hip fracture. Surgery and PT are pending. CSW met with patient and her daughter/ HPOA Jenny Reichmann [phone] was at bedside. Patient was alert and oriented X4 and was laying in the bed. CSW introduced self and explained role of CSW department. Per patient she lives alone at [facility] and her husband passed away in 1988-08-17. Per daughter she lives in [place]. [place] explained that PT will evaluate patient after surgery and make a recommendation of home health or SNF. Patient and daughter prefer SNF at [facility] in [place]. CSW explained that medicare requires a 3 night qualifying inpatient stay in a hospital in order to pay for SNF. Patient was admitted to inpatient 10/23/17. Patient and daughter verbalized their understanding. FL2 complete and faxed out. CSW  will continue to follow and assist as needed.   Employment status:  Retired Forensic scientist:  Medicare PT Recommendations:  Not assessed at this time [place] / Referral to community resources:  [place]  Patient/Family's Response to care:  Patient and her daughter prefer SNF.   Patient/Family's Understanding of and Emotional Response to Diagnosis, Current Treatment, and Prognosis:  Patient and her daughter were very pleasant and thanked CSW for assistance.   Emotional Assessment Appearance:  Appears stated age Attitude/Demeanor/Rapport:    Affect (typically observed):  Accepting, Adaptable, Pleasant Orientation:  Oriented to Self, Oriented to Place, Oriented to  Time, Oriented to Situation Alcohol / Substance use:  Not Applicable Psych involvement (Current and /or in the community):  No (Comment)  Discharge Needs  Concerns to be addressed:  Discharge Planning Concerns Readmission within the last 30 days:  No Current discharge risk:  Dependent with Mobility Barriers to Discharge:  Continued Medical Work up   [employer], Veronia Beets, LCSW 10/23/2017, 3:30 PM

## 2017-10-23 NOTE — Anesthesia Procedure Notes (Addendum)
Spinal  Patient location during procedure: OR Start time: 10/23/2017 4:07 PM End time: 10/23/2017 4:22 PM Staffing Anesthesiologist: Geneveive Furness, Precious Haws, MD Resident/CRNA: Aline Brochure, CRNA Other anesthesia staff: Docia Furl, RN Performed: anesthesiologist, other anesthesia staff and resident/CRNA  Preanesthetic Checklist Completed: patient identified, site marked, surgical consent, pre-op evaluation, timeout performed, IV checked, risks and benefits discussed and monitors and equipment checked Spinal Block Patient position: sitting Prep: ChloraPrep Patient monitoring: heart rate, continuous pulse ox, blood pressure and cardiac monitor Approach: midline Location: L3-4 Injection technique: single-shot Needle Needle type: Whitacre and Introducer  Needle gauge: 24 G Needle length: 9 cm Assessment Sensory level: T10 Additional Notes First attempts by SRNA, final attempt by MDA.  Negative paresthesia. Negative blood return. Positive free-flowing CSF. Expiration date of kit checked and confirmed. Patient tolerated procedure well, without complications.

## 2017-10-23 NOTE — Progress Notes (Signed)
Echocardiogram report Normal LV systolic function Normal RV systolic function  no significant valvular disease There is markedly elevated right heart pressures Right heart pressures estimated at 70 to 80 mmHg  Right heart pressures are likely chronic in the setting of severe underlying COPD Would continue her revatio 20 mg TID  This could be started after surgery She is only been taking this half dose once a day  Would minimize IV fluids even Hep-Lock For any respiratory distress would give IV Lasix  Would try to minimize fluid shifts with general anesthesia and induction Given her severe underlying lung disease and high right-sided pressures she is at moderate to high risk of acute respiratory distress  High right-sided pressures would not be easy to fix on an acute basis Would take months of trials with potential pulmonary hypertension medications  Would proceed with surgery with the understanding she is at  least moderate to high risk from a respiratory standpoint  Signed, Dossie Arbour, MD, Ph.D [facility]

## 2017-10-23 NOTE — H&P (Signed)
[facility] [employer] [facility] [place] [facility]   PATIENT NAME: Gina Saunders    MR#:  [id number]  DATE OF BIRTH:  Jul 11, 1934  DATE OF ADMISSION:  10/22/2017  PRIMARY CARE PHYSICIAN: Gina Kayser, MD   REQUESTING/REFERRING PHYSICIAN: Dionne Bucy, MD  CHIEF COMPLAINT:   Chief Complaint  Patient presents with  . Hip Injury    HISTORY OF PRESENT ILLNESS:  Gina Saunders  is a 83 y.o. female with a known history of COPD (4L [place] home O2), pulmonary HTN (on Revatio), chronic diastolic CHF (per 06/20/2016 Echo), HTN, HLD, hypothyroidism, GERD, osteoporosis, vit D def, anx, DJD/OA, tremor, who p/w 1d Hx mechanical fall w/ L intertrochanteric hip fracture. Pt lives at [facility] senior living[facility]. She is functional and fairly independent at baseline. She is AAOx3 at the time of my Hx/examination. Hx provided by pt and her daughter (DPOA) at bedside. Pt ambulates w/ rollator at baseline. @~2030PM on Thursday evening (10/22/2017), pt was in the bathroom, and was turning to get on the commode, when she states she fell. She states she had the rollator with her in the bathroom, but she is unable to recall the mechanics of her fall, and does not know if she tripped, slipped or lost balance. She denies lightheadedness, vertigo or LOC. She states that her left foot turned inward, and she fell towards her left side and backwards, hitting her L hip and L shoulder on the floor. She states she had immediate severe pain of her L hip, and was unable to move her left leg. She states her L shoulder did hurt initially, but the pain has since resolved. Her L hip pain obviously persists. She endorses numbness below the L knee. At the time of her fall, pt says she may have hit her head "slightly", but she does not know which part of her head she hit, and she denies any headache or scalp injury. She endorses baseline constipation. She denies F/C/N/V/D/AP, CP, SOB, palpitations, diaphoresis,  night sweats, rigors, cough, hemoptysis, wheezing, HA, vertigo, blurred vision, LH, LOC, urinary symptoms. She is in mild distress 2/2 pain at the time of my Hx/examination.  Pt states she has an Theme park manager (Dr. Tami Saunders) and Pulmonologist (Dr. Leane Saunders), but she states she has not seen either of them in "a long time", as her PCP manages all of her medical issues. Her daughter, Gina Saunders, is her medical DPOA [phone]  PAST MEDICAL HISTORY:   Past Medical History:  Diagnosis Date  . CHF (congestive heart failure) (HCC)   . COPD (chronic obstructive pulmonary disease) (HCC)   . Hypertension    COPD (4L [place] home O2), pulmonary HTN (on Revatio), chronic diastolic CHF (per 06/20/2016 Echo), HTN, HLD, hypothyroidism, GERD, osteoporosis, vit D def, anx, DJD/OA, tremor  PAST SURGICAL HISTORY:   Past Surgical History:  Procedure Laterality Date  . TONSILLECTOMY     Breast Bx  SOCIAL HISTORY:   Social History   Tobacco Use  . Smoking status: Former Games developer  . Smokeless tobacco: Never Used  Substance Use Topics  . Alcohol use: Never    Frequency: Never    FAMILY HISTORY:  History reviewed. No pertinent family history.  DRUG ALLERGIES:   Allergies  Allergen Reactions  . Codeine     REVIEW OF SYSTEMS:   Review of Systems  Constitutional: Negative for chills, diaphoresis, fever, malaise/fatigue and weight loss.  HENT: Negative for congestion, ear pain, hearing loss, nosebleeds, sinus pain, sore throat  and tinnitus.   Eyes: Negative for blurred vision, double vision and photophobia.  Respiratory: Negative for cough, hemoptysis, sputum production, shortness of breath and wheezing.   Cardiovascular: Negative for chest pain, palpitations, orthopnea, claudication, leg swelling and PND.  Gastrointestinal: Positive for constipation. Negative for abdominal pain, blood in stool, diarrhea, heartburn, melena, nausea and vomiting.  Genitourinary: Negative for dysuria,  frequency, hematuria and urgency.  Musculoskeletal: Positive for falls and joint pain (+) L hip pain. Negative for back pain, myalgias and neck pain.  Skin: Negative for itching and rash.  Neurological: Positive for tremors and sensory change (+) LLE numbness (below L knee). Negative for dizziness, tingling, speech change, focal weakness, seizures, loss of consciousness, weakness and headaches.   MEDICATIONS AT HOME:   Prior to Admission medications   Medication Sig Start Date End Date Taking? Authorizing Provider  acetaminophen (TYLENOL) 325 MG tablet Take 650 mg by mouth every 6 (six) hours as needed.    Yes [provider]  amLODipine (NORVASC) 5 MG tablet Take 5 mg by mouth daily.  08/21/17  Yes [provider]  levothyroxine (SYNTHROID) 88 MCG tablet TAKE 1 TABLET DAILY 08/24/17  Yes [provider]  lisinopril (PRINIVIL,ZESTRIL) 40 MG tablet Take 40 mg by mouth daily.  08/24/17  Yes [provider]  meloxicam (MOBIC) 15 MG tablet TAKE 1 TABLET DAILY 08/24/17  Yes [provider]  propranolol (INDERAL) 20 MG tablet 1 TAB EVERY 12 HOURS 08/24/17  Yes [provider]  ranitidine (ZANTAC) 150 MG tablet Take 150 mg by mouth 2 (two) times daily.    Yes [provider]  sertraline (ZOLOFT) 100 MG tablet Take 100 mg by mouth daily.  08/24/17  Yes [provider]  sildenafil (REVATIO) 20 MG tablet Take 20 mg by mouth 2 (two) times daily.  08/24/17  Yes [provider]  simvastatin (ZOCOR) 20 MG tablet TAKE 1 TABLET BY MOUTH AT BEDTIME 08/24/17  Yes [provider]      VITAL SIGNS:  Blood pressure (!) 151/96, pulse (!) 33, temperature 97.7 F (36.5 C), temperature source Oral, resp. rate 16, height 5\' 5"  (1.651 m), weight 51.7 kg (114 lb), SpO2 91 %.  PHYSICAL EXAMINATION:  Physical Exam  Constitutional: She is oriented to person, place, and time. She appears well-developed. She is active and cooperative.   Non-toxic appearance. She does not have a sickly appearance. She does not appear ill. She appears distressed (+) mild distress 2/2 pain.  HENT:  Head: Normocephalic.  Mouth/Throat: Mucous membranes are dry. No oropharyngeal exudate.  Eyes: Conjunctivae, EOM and lids are normal. No scleral icterus.  Neck: Neck supple. No JVD present. No thyromegaly present.  Cardiovascular: Regular rhythm, S1 normal, S2 normal and normal heart sounds.  No extrasystoles are present. Tachycardia present. Exam reveals no gallop, no S3, no S4, no distant heart sounds and no friction rub.  No murmur heard. Pulmonary/Chest: Effort normal and breath sounds normal. No accessory muscle usage or stridor. No apnea and no bradypnea. No respiratory distress. She has no decreased breath sounds. She has no wheezes. She has no rhonchi. She has no rales.  Abdominal: Soft. Bowel sounds are normal. She exhibits no distension. There is no tenderness. There is no rebound and no guarding.  Musculoskeletal: She exhibits tenderness (+) TTP L hip. She exhibits no edema.       Left hip: She exhibits decreased range of motion and tenderness.  Lymphadenopathy:    She has no cervical adenopathy.  Neurological: She is alert and oriented to person, place, and time. She is not disoriented.  Skin: Skin is warm, dry and intact. No rash noted. She is not diaphoretic. No erythema.  Psychiatric: She has a normal mood and affect. Her speech is normal and behavior is normal. Judgment and thought content normal. Cognition and memory are normal.   LABORATORY PANEL:   CBC Recent Labs  Lab 10/22/17 2111  WBC 9.3  HGB 14.3  HCT 43.4  PLT 250   ------------------------------------------------------------------------------------------------------------------  Chemistries  Recent Labs  Lab 10/22/17 2111  NA 137  K 3.8  CL 101  CO2 25  GLUCOSE 119*  BUN 28*  CREATININE 0.61  CALCIUM 9.5    ------------------------------------------------------------------------------------------------------------------  Cardiac Enzymes No results for input(s): TROPONINI in the last 168 hours. ------------------------------------------------------------------------------------------------------------------  RADIOLOGY:  Dg Chest Portable 1 View  Result Date: 10/22/2017 CLINICAL DATA:  Fall EXAM: PORTABLE CHEST 1 VIEW COMPARISON:  None. FINDINGS: Diffuse interstitial prominence. Calcific aortic atherosclerosis. No focal airspace consolidation. No pleural effusion or pneumothorax. IMPRESSION: Diffuse interstitial prominence may indicate emphysema or mild/early pulmonary edema. No radiographic evidence of pneumonia. Electronically Signed   By: Deatra Robinson M.D.   On: 10/22/2017 22:19   Dg Hip Unilat With Pelvis 2-3 Views Left  Result Date: 10/22/2017 CLINICAL DATA:  Fall EXAM: DG HIP (WITH OR WITHOUT PELVIS) 2-3V LEFT COMPARISON:  None. FINDINGS: There is an acute intertrochanteric fracture of the left femur with medial angulation. The femoral head remains situated in the acetabulum. No pelvic fracture or diastasis. Normal right hip. IMPRESSION: Intertrochanteric left femoral fracture. Electronically Signed   By: Deatra Robinson M.D.   On: 10/22/2017 22:13   IMPRESSION AND PLAN:   A/P: 74F mechanical fall w/ L intertrochanteric hip fracture.  1.) Mechanical fall w/ L intertrochanteric hip fracture: Pt p/w 1d Hx mechanical fall, as per HPI. LLE shortened and externally rotated, immobile, (+) pain/TTP. Imaging (+) L intertrochanteric femoral fracture. Consult Orthopedic Surgery for surgical repair. Bedrest, NWB LLE, pain ctrl. Foley. Perioperative ABx and DVT PPx TBD. AM coags. NPO.  2.) Perioperative evaluation: Pt w/ COPD, on 4L chronic home O2, as well as pulmonary HTN, on Revatio. Pt also w/ documented Hx chronic diastolic CHF, as of 2018 Echo, as well as HTN + HLD, though she does not have a  documented Hx of ASCVD. I believe she is of moderate cardiopulmonary risk for a moderate-risk noncardiovascular surgical procedure, owing to her intrinsic lung disease, which I suspect may pose risk as relating to intubation/general anesthesia. As such, I believe Pulmonology consultation is in order, for further preoperative optimization/recommendations. CXR reviewed, and does not appear to demonstrate acute pulmonary pathology. EKG demonstrates rhythmic regular artifact, which is being caused by the pt's upper extremity tremor. Her cardiac rhythm is sinus tachycardia, 2/2 pain. There are no acute ST changes. I do not believe further cardiac testing is needed. Labs largely unremarkable. C/w Revatio, statin, beta blocker, ACE-I, home medications.  3.) Hyperglycemia: Glucose 119. Likely reactive. BMI 18.  4.) Dehydration: Dry MM, endorses thirst. BUN 28, Cr 0.61, ratio 45.9. IVF LR @ 50cc/hr, PO fluids.  5.) COPD/PHTN: c/w O2, Revatio. DuoNebs PRN.  6.) HTN/HLD/CHF: c/w Amlodipine, Lisinopril, Propranolol, Zocor.  7.) Hypothyroidism: c/w Levothyroxine.  8.) GERD: Pepcid (formulary sub for home Zantac).  9.) Anx: c/w Zoloft.  10.) FEN/GI: NPO, IVF, Pepcid.  11.) DVT PPx: SCD RLE.  12.) Code status: Full code per daughter.  13.) Disposition: Admission, pt expected to stay >  2 midnights.   All the records are reviewed and case discussed with ED provider. Management plans discussed with the patient, family and they are in agreement.  CODE STATUS: Full code.  TOTAL TIME TAKING CARE OF THIS PATIENT: 90 minutes.    Barbaraann Rondo M.D on 10/23/2017 at 1:33 AM  Between 7am to 6pm - Pager - [phone]  After 6pm go to [email or web address] - Social research officer, government  [facility]  Office  [phone]  CC: Primary care physician; Gina Kayser, MD   Note: This dictation was prepared with Dragon dictation along with smaller phrase technology. Any transcriptional  errors that result from this process are unintentional.

## 2017-10-23 NOTE — Anesthesia Preprocedure Evaluation (Addendum)
Anesthesia Evaluation  Patient identified by MRN, date of birth, ID band Patient awake and Patient confused    Reviewed: Allergy & Precautions, H&P , NPO status , Patient's Chart, lab work & pertinent test results  History of Anesthesia Complications Negative for: history of anesthetic complications  Airway Mallampati: III  TM Distance: <3 FB Neck ROM: limited    Dental  (+) Chipped, Poor Dentition   Pulmonary shortness of breath, COPD,  COPD inhaler and oxygen dependent, former smoker,           Cardiovascular Exercise Tolerance: Poor hypertension, (-) angina+CHF and + DOE  (-) Past MI      Neuro/Psych PSYCHIATRIC DISORDERS    GI/Hepatic GERD  Controlled,  Endo/Other  Hypothyroidism   Renal/GU   negative genitourinary   Musculoskeletal  (+) Arthritis ,   Abdominal   Peds  Hematology negative hematology ROS (+)   Anesthesia Other Findings ECHO report from cardiology clearance note today Echocardiogram report Normal LV systolic function Normal RV systolic function  no significant valvular disease There is markedly elevated right heart pressures Right heart pressures estimated at 70 to 80 mmHg  Right heart pressures are likely chronic in the setting of severe underlying COPD  Past Medical History: No date: (HFpEF) heart failure with preserved ejection fraction (HCC)     Comment:  a. Prev seen by cardiology @ [facility] Arvilla Meres)               in 05/2016. RHC @ that time revealed PAH (45/15); b.               06/2016 Echo: "diastolic dyfunction" per PCP note. No date: Arthralgia No date: Arthritis No date: COPD (chronic obstructive pulmonary disease) (HCC)     Comment:  a. On home O2 @ 4lpm. No date: Essential hypertension No date: Hyperlipidemia No date: Hypertension No date: Hypothyroidism No date: Impaired glucose tolerance No date: Mood disorder (HCC) No date: PAH (pulmonary artery  hypertension) (HCC)     Comment:  a. On revatio and home O2.  Followed by pulmonology. No date: Resting tremor  Past Surgical History: No date: TONSILLECTOMY  BMI    Body Mass Index:  18.97 kg/m      Reproductive/Obstetrics (+) Pregnancy                           Anesthesia Physical Anesthesia Plan  ASA: IV  Anesthesia Plan: Spinal   Post-op Pain Management:    Induction:   PONV Risk Score and Plan:   Airway Management Planned: Natural Airway and Nasal Cannula  Additional Equipment:   Intra-op Plan:   Post-operative Plan:   Informed Consent: I have reviewed the patients History and Physical, chart, labs and discussed the procedure including the risks, benefits and alternatives for the proposed anesthesia with the patient or authorized representative who has indicated his/her understanding and acceptance.   Dental Advisory Given  Plan Discussed with: Anesthesiologist, CRNA and Surgeon  Anesthesia Plan Comments: (Patient and family consented  Patient and family reports no bleeding problems and no anticoagulant use.  Plan for spinal with backup GA Patient and family consented that she could require post op ventilation if GA is required Patient and family consented that my understanding is that this facility does not have access to nitric oxide which would cause [place] trouble if her pulmonary HTN was exacerbated during the case, offered the patient and family that we could transfer her  to a facility with this but she declined. Patient and family informed that they are higher risk for complications from anesthesia during this procedure due to their medical history.  They voiced understanding.  Patient and family consented for risks of anesthesia including but not limited to:  - adverse reactions to medications - risk of bleeding, infection, nerve damage and headache - risk of failed spinal - damage to teeth, lips or other oral mucosa - sore throat  or hoarseness - Damage to heart, brain, lungs or loss of life  Patient and family voiced understanding.)       Anesthesia Quick Evaluation

## 2017-10-23 NOTE — ED Notes (Signed)
Admitting provider at bedside He reviewed EKG

## 2017-10-23 NOTE — Clinical Social Work Placement (Signed)
   CLINICAL SOCIAL WORK PLACEMENT  NOTE  Date:  10/23/2017  Patient Details  Name: Gina Saunders MRN: [id number] Date of Birth: Dec 31, 1934  Clinical Social Work is seeking post-discharge placement for this patient at the Skilled  Nursing Facility level of care (*CSW will initial, date and re-position this form in  chart as items are completed):  Yes   Patient/family provided with [facility] Clinical Social Work Department's list of facilities offering this level of care within the geographic area requested by the patient (or if unable, by the patient's family).  Yes   Patient/family informed of their freedom to choose among providers that offer the needed level of care, that participate in Medicare, Medicaid or managed care program needed by the patient, have an available bed and are willing to accept the patient.  Yes   Patient/family informed of [facility]'s ownership interest in [facility] and [facility], as well as of the fact that they are under no obligation to receive care at these facilities.  PASRR submitted to EDS on       PASRR number received on       Existing PASRR number confirmed on 10/23/17     FL2 transmitted to all facilities in geographic area requested by pt/family on 10/23/17     FL2 transmitted to all facilities within larger geographic area on       Patient informed that his/her managed care company has contracts with or will negotiate with certain facilities, including the following:            Patient/family informed of bed offers received.  Patient chooses bed at       Physician recommends and patient chooses bed at      Patient to be transferred to   on  .  Patient to be transferred to facility by       Patient family notified on   of transfer.  Name of family member notified:        PHYSICIAN       Additional Comment:    _______________________________________________ Tesla Keeler, Darleen Crocker, LCSW 10/23/2017, 3:29 PM

## 2017-10-23 NOTE — Anesthesia Post-op Follow-up Note (Signed)
Anesthesia QCDR form completed.        

## 2017-10-23 NOTE — Transfer of Care (Signed)
Immediate Anesthesia Transfer of Care Note  Patient: Gina Saunders  Procedure(s) Performed: INTRAMEDULLARY (IM) NAIL INTERTROCHANTRIC (Left Hip)  Patient Location: PACU  Anesthesia Type:General  Level of Consciousness: awake, alert  and oriented  Airway & Oxygen Therapy: Patient Spontanous Breathing and Patient connected to face mask oxygen  Post-op Assessment: Report given to RN and Post -op Vital signs reviewed and stable  Post vital signs: Reviewed and stable  Last Vitals:  Vitals Value Taken Time  BP 160/89 10/23/2017  5:50 PM  Temp    Pulse 77 10/23/2017  5:51 PM  Resp 23 10/23/2017  5:51 PM  SpO2 90 % 10/23/2017  5:51 PM  Vitals shown include unvalidated device data.  Last Pain:  Vitals:   10/23/17 1339  TempSrc:   PainSc: 3          Complications: No apparent anesthesia complications

## 2017-10-23 NOTE — Consult Note (Signed)
ORTHOPAEDIC CONSULTATION  REQUESTING PHYSICIAN: Shaune Pollack, MD  Chief Complaint:   Left hip pain.  History of Present Illness: Gina Saunders is a 83 y.o. female with a history of COPD, hypertension, congestive heart failure who lives at an assisted living facility.  The patient was in her usual state of health last night when she apparently lost her balance and fell onto her left side while trying to get up from the commode.  She was brought to the emergency room where x-rays demonstrated a displaced intertrochanteric fracture of her left hip.  The patient denies any associated injuries.  She did not strike her head or lose consciousness.  She denies any lightheadedness, dizziness, chest pain, shortness of breath, or other symptoms that may have precipitated her fall.  She has been admitted to the hospital service to be optimized medically in preparation for definitive management of this injury.  Past Medical History:  Diagnosis Date  . CHF (congestive heart failure) (HCC)   . COPD (chronic obstructive pulmonary disease) (HCC)   . Hypertension    Past Surgical History:  Procedure Laterality Date  . TONSILLECTOMY     Social History   Socioeconomic History  . Marital status: Widowed    Spouse name: Not on file  . Number of children: Not on file  . Years of education: Not on file  . Highest education level: Not on file  Occupational History  . Not on file  Social Needs  . Financial resource strain: Not on file  . Food insecurity:    Worry: Not on file    Inability: Not on file  . Transportation needs:    Medical: Not on file    Non-medical: Not on file  Tobacco Use  . Smoking status: Former Games developer  . Smokeless tobacco: Never Used  Substance and Sexual Activity  . Alcohol use: Never    Frequency: Never  . Drug use: Never  . Sexual activity: Not on file  Lifestyle  . Physical activity:    Days per week: Not  on file    Minutes per session: Not on file  . Stress: Not on file  Relationships  . Social connections:    Talks on phone: Not on file    Gets together: Not on file    Attends religious service: Not on file    Active member of club or organization: Not on file    Attends meetings of clubs or organizations: Not on file    Relationship status: Not on file  Other Topics Concern  . Not on file  Social History Narrative  . Not on file   History reviewed. No pertinent family history. Allergies  Allergen Reactions  . Codeine    Prior to Admission medications   Medication Sig Start Date End Date Taking? Authorizing Provider  acetaminophen (TYLENOL) 325 MG tablet Take 650 mg by mouth every 6 (six) hours as needed.    Yes [provider]  amLODipine (NORVASC) 5 MG tablet Take 5 mg by mouth daily.  08/21/17  Yes [provider]  levothyroxine (SYNTHROID) 88 MCG tablet TAKE 1 TABLET DAILY 08/24/17  Yes [provider]  lisinopril (PRINIVIL,ZESTRIL) 40 MG tablet Take 40 mg by mouth daily.  08/24/17  Yes [provider]  meloxicam (MOBIC) 15 MG tablet TAKE 1 TABLET DAILY 08/24/17  Yes [provider]  propranolol (INDERAL) 20 MG tablet 1 TAB EVERY 12 HOURS 08/24/17  Yes [provider]  ranitidine (ZANTAC) 150 MG  tablet Take 150 mg by mouth 2 (two) times daily.    Yes [provider]  sertraline (ZOLOFT) 100 MG tablet Take 100 mg by mouth daily.  08/24/17  Yes [provider]  sildenafil (REVATIO) 20 MG tablet Take 20 mg by mouth 2 (two) times daily.  08/24/17  Yes [provider]  simvastatin (ZOCOR) 20 MG tablet TAKE 1 TABLET BY MOUTH AT BEDTIME 08/24/17  Yes [provider]   Dg Chest Portable 1 View  Result Date: 10/22/2017 CLINICAL DATA:  Fall EXAM: PORTABLE CHEST 1 VIEW COMPARISON:  None. FINDINGS: Diffuse interstitial prominence. Calcific aortic atherosclerosis. No focal airspace consolidation. No pleural  effusion or pneumothorax. IMPRESSION: Diffuse interstitial prominence may indicate emphysema or mild/early pulmonary edema. No radiographic evidence of pneumonia. Electronically Signed   By: Deatra Robinson M.D.   On: 10/22/2017 22:19   Dg Hip Unilat With Pelvis 2-3 Views Left  Result Date: 10/22/2017 CLINICAL DATA:  Fall EXAM: DG HIP (WITH OR WITHOUT PELVIS) 2-3V LEFT COMPARISON:  None. FINDINGS: There is an acute intertrochanteric fracture of the left femur with medial angulation. The femoral head remains situated in the acetabulum. No pelvic fracture or diastasis. Normal right hip. IMPRESSION: Intertrochanteric left femoral fracture. Electronically Signed   By: Deatra Robinson M.D.   On: 10/22/2017 22:13    Positive ROS: All other systems have been reviewed and were otherwise negative with the exception of those mentioned in the HPI and as above.  Physical Exam: General:  Alert, no acute distress Psychiatric:  Patient is competent for consent with normal mood and affect   Cardiovascular:  No pedal edema Respiratory:  No wheezing, non-labored breathing GI:  Abdomen is soft and non-tender Skin:  No lesions in the area of chief complaint Neurologic:  Sensation intact distally Lymphatic:  No axillary or cervical lymphadenopathy  Orthopedic Exam:  Orthopedic examination is limited to the left hip and lower extremity.  The left lower extremity is somewhat shortened and externally rotated as compared to the right.  Skin inspection around the left hip is unremarkable.  There is no swelling, erythema, ecchymosis, abrasions, or other skin abnormalities identified.  She has tenderness to palpation over the lateral aspect of her left hip.  She has more severe pain with any attempted active or passive motion of the hip.  She is able to actively dorsiflex and plantarflex her toes and ankle.  Sensation is intact to light touch to all distributions.  She has good capillary refill to her left foot.  X-rays:   X-rays of the pelvis and left hip are available for review.  These films demonstrate a comminuted three-part intertrochanteric fracture of the left hip.  No significant degenerative changes are identified.  No lytic lesions are noted.  Assessment: Displaced three-part intertrochanteric fracture left hip.  Plan: The treatment options have been discussed with the patient and her daughter, who is at the bedside, including both surgical nonsurgical choices.  The patient and her daughter would like to proceed with surgical intervention to include an intramedullary nailing of the intertrochanteric fracture.  This procedure has been discussed in detail with the patient and her daughter, as have the potential risks (including bleeding, infection, nerve and/or blood vessel injury, persistent or recurrent pain, malunion and/or nonunion, leg length inequality, need for further surgery, blood clots, strokes, heart attacks and/or arrhythmias, etc.) to see him benefits.  The patient and her daughter state their understanding and wish to proceed.  A formal written consent will be  obtained by the nursing staff.  Thank you for asked me to participate in the care of this most unfortunate woman.  I will be happy to follow her with you.   Gina Amos, MD  Beeper #:  [phone]  10/23/2017 7:54 AM

## 2017-10-23 NOTE — Op Note (Signed)
10/23/2017  5:47 PM  Patient:   Gina Saunders  Pre-Op Diagnosis:   Closed displaced 3 part intertrochanteric fracture, left hip.  Post-Op Diagnosis:   Same  Procedure:   Reduction and internal fixation of displaced intertrochanteric left hip fracture with Biomet Affixis TFN nail.  Surgeon:   Maryagnes Amos, MD  Assistant:   None  Anesthesia:   Spinal  Findings:   As above  Complications:   None  EBL:   25 cc  Fluids:   500 cc crystalloid  UOP:   650 cc  TT:   None  Drains:   None  Closure:   Staples  Implants:   Biomet Affixis 9 x 360 mm TFN with a 85 mm lag screw and a 38 mm distal interlocking screw  Brief Clinical Note:   The patient is an 83 year old female who sustained the above-noted injury yesterday evening when she apparently fell off of the commode at her assisted living facility. She presented to the emergency room where x-rays demonstrated the above-noted injury. The patient has been cleared medically and presents at this time for reduction and internal fixation of the displaced 3-part intertrochanteric left hip fracture.  Procedure:   The patient was brought into the operating room. After adequate spinal anesthesia was obtained, a Foley catheter was placed by the nursing staff. The patient was lain in the supine position on the fracture table. The uninjured leg was placed in a flexed and abducted position while the injured lower extremity was placed in longitudinal traction. The fracture was reduced using longitudinal traction and internal rotation. The adequacy of reduction was verified fluoroscopically in AP and lateral projections and found to be near anatomic. The lateral aspects of the left hip and thigh were prepped with ChloraPrep solution before being draped sterilely. Preoperative antibiotics were administered. A timeout was performed to verify the appropriate surgical site. The greater trochanter was identified fluoroscopically and an approximately 3 cm  incision made about 2-3 fingerbreadths above the tip of the greater trochanter. The incision was carried down through the subcutaneous tissues to expose the gluteal fascia. This was split the length of the incision, providing access to the tip of the trochanter. Under fluoroscopic guidance, a guidewire was drilled through the tip of the trochanter into the proximal metaphysis to the level of the lesser trochanter. After verifying its position fluoroscopically in AP and lateral projections, it was overreamed with the initial reamer to the depth of the lesser trochanter. A guidewire was passed down through the femoral canal to the supracondylar region. The adequacy of guidewire position was verified fluoroscopically in AP and lateral projections before the length of the guidewire within the canal was measured and found to be 385 mm. Therefore, a 360 mm length nail was selected. The guidewire was overreamed sequentially using the flexible reamers, beginning with a 9 mm reamer and progressing to an 11 mm reamer. This provided good cortical chatter. The 9 x 360 mm Biomet Affixis TFN rod was selected and advanced to the appropriate depth, as verified fluoroscopically.   The guide system for the lag screw was positioned and advanced through an approximately 2 cm stab incision over the lateral aspect of the proximal femur. The guidewire was drilled up through the trochanteric femoral nail and into the femoral neck to rest within 5 mm of subchondral bone. After verifying its position in the femoral neck and head in both AP and lateral projections, the guidewire was measured and found to be optimally  replicated by an 85 mm lag screw. The guidewire was overreamed to the appropriate depth before the lag screw was inserted and advanced to the appropriate depth as verified fluoroscopically in AP and lateral projections. The locking screw was advanced, then backed off a quarter turn to set the lag screw. Again the adequacy of  hardware position and fracture reduction was verified fluoroscopically in AP and lateral projections and found to be excellent.  Attention was directed distally. Using the "perfect circle" technique, the leg and fluoroscopy machine were positioned appropriately. An approximately 1.5 cm stab incision was made over the skin at the appropriate point before the drill bit was advanced through the cortex and across the static hole of the nail. The appropriate length of the screw was determined before the 38 mm distal interlocking screw was positioned, then advanced and tightened securely. Again the adequacy of screw position was verified fluoroscopically in AP and lateral projections and found to be excellent.  The wounds were irrigated thoroughly with sterile saline solution before the abductor fascia was reapproximated using #1 Vicryl interrupted sutures. The subcutaneous tissues were closed using 2-0 Vicryl interrupted sutures. The skin was closed using staples. A total of 30 cc of 0.5% Sensorcaine with epinephrine was injected in and around all incisions. Sterile occlusive dressings were applied to all wounds before the patient was transferred back to her hospital bed. The patient was then transferred to the recovery room in satisfactory condition after tolerating the procedure well.

## 2017-10-23 NOTE — Consult Note (Signed)
Cardiology Consult    Patient ID: Gina Saunders MRN: [id number], DOB/AGE: Jul 12, 1934   Admit date: 10/22/2017 Date of Consult: 10/23/2017  Primary Physician: Ezequiel Kayser, MD Primary Cardiologist: No primary care provider on file. Requesting Provider: Q. Imogene Burn, MD  Patient Profile    Gina Saunders is a 83 y.o. female with a history of HFpEF, PAH, OA, HTN, HL, Hypothyroidism, tremor, and COPD on home O2, who is being seen today for preoperative cardiovascular exam and CHF at the request of Dr. Imogene Burn.  Past Medical History   Past Medical History:  Diagnosis Date  . (HFpEF) heart failure with preserved ejection fraction (HCC)    a. Prev seen by cardiology @ [facility]) in 05/2016. RHC @ that time revealed PAH (45/15); b. 06/2016 Echo: "diastolic dyfunction" per PCP note.  . Arthralgia   . Arthritis   . COPD (chronic obstructive pulmonary disease) (HCC)    a. On home O2 @ 4lpm.  . Essential hypertension   . Hyperlipidemia   . Hypertension   . Hypothyroidism   . Impaired glucose tolerance   . Mood disorder (HCC)   . PAH (pulmonary artery hypertension) (HCC)    a. On revatio and home O2.  Followed by pulmonology.  [name] Resting tremor     Past Surgical History:  Procedure Laterality Date  . TONSILLECTOMY       Allergies  Allergies  Allergen Reactions  . Codeine     History of Present Illness    83 y/o ? with the above complex PMH including HFpEF, PAH, HTN, HL, COPD on home O2, hypothyroidism, OA, and resting tremor.  She was previously seen by cardiology in 05/2016, @ which time she was admitted to [facility] in [place] with CHF following a fall @ home.  Records from that admission are not currently available but per PCP notes, she was diuresed and underwent RHC revealing PAH w/ nl PCWP.  In 06/2016 echo reportedly showed diastolic dysfxn.  She never f/u with cardiology but is followed by pulmonology for [facility] and has been on revatio and home O2.  Activity at  home has been limited in the setting of OA and chronic DOE and so about 4 months ago, her family moved her into [facility] assisted living, here in [place].  There, she has been receiving PT regularly.  She ambulated with a rollator and has improved some, but still experiences DOE with distances greater than those required to move around her apartment, and so she uses a wheelchair when going down to the cafeteria.  She does not experience chest pain and has not required a diuretic at home since late 2017.  She denies pnd, orthopnea, n, v, dizziness, syncope, edema, or early satiety.    On the evening of 5/9, she was maneuvering in bathroom after using the toilet, lost her balance, and fell, striking her left hip and shoulder on the floor, followed by severe left hip pain.  She was taken to the [facility] ED where x-rays showed intertrochanteric left femoral fx. CXR w/ emphysema vs mild edema.  ECG with baseline artifact in setting of tremor but most likely sinus w/ PVCs.  Labs ok.  She was admitted and seen by surgery with plan for IM nail this afternoon.  We've been asked to provide preop cardiovascular eval.  Currently only complaint is that of left leg pain.  Inpatient Medications    . amLODipine  5 mg Oral Daily  . famotidine  20 mg Oral Daily  . levothyroxine  88 mcg Oral QAC breakfast  . lisinopril  40 mg Oral Daily  . propranolol  20 mg Oral BID  . sertraline  100 mg Oral Daily  . sildenafil  20 mg Oral BID  . simvastatin  20 mg Oral QHS    Family History    Family History  Problem Relation Age of Onset  . Heart failure Mother   . Heart attack Father    indicated that her mother is deceased. She indicated that her father is deceased.   Social History    Social History   Socioeconomic History  . Marital status: Widowed    Spouse name: Not on file  . Number of children: Not on file  . Years of education: Not on file  . Highest education level: Not on file  Occupational History  .  Not on file  Social Needs  . Financial resource strain: Not on file  . Food insecurity:    Worry: Not on file    Inability: Not on file  . Transportation needs:    Medical: Not on file    Non-medical: Not on file  Tobacco Use  . Smoking status: Former Games developer  . Smokeless tobacco: Never Used  . Tobacco comment: quit ~ 1979 after ~ 30 yrs, 1ppd.  Substance and Sexual Activity  . Alcohol use: Never    Frequency: Never  . Drug use: Never  . Sexual activity: Not on file  Lifestyle  . Physical activity:    Days per week: Not on file    Minutes per session: Not on file  . Stress: Not on file  Relationships  . Social connections:    Talks on phone: Not on file    Gets together: Not on file    Attends religious service: Not on file    Active member of club or organization: Not on file    Attends meetings of clubs or organizations: Not on file    Relationship status: Not on file  . Intimate partner violence:    Fear of current or ex partner: Not on file    Emotionally abused: Not on file    Physically abused: Not on file    Forced sexual activity: Not on file  Other Topics Concern  . Not on file  Social History Narrative   Lives @ [facility].  Uses Rollator to get around. Receives PT regularly.  Ambulation limited by chronic DOE.     Review of Systems    General:  No chills, fever, night sweats or weight changes.  Cardiovascular:  No chest pain, ++ chronic dyspnea on exertion, no edema, orthopnea, palpitations, paroxysmal nocturnal dyspnea. Dermatological: No rash, lesions/masses Respiratory: No cough, +++ chronic dyspnea Urologic: No hematuria, dysuria Abdominal:   No nausea, vomiting, diarrhea, bright red blood per rectum, melena, or hematemesis Neurologic:  No visual changes, wkns, changes in mental status. MSK: +++ left leg pain. All other systems reviewed and are otherwise negative except as noted above.  Physical Exam    Blood pressure (!) 147/71, pulse 65,  temperature 97.7 F (36.5 C), temperature source Oral, resp. rate 16, height 5\' 5"  (1.651 m), weight 114 lb (51.7 kg), SpO2 91 %.  General: Pleasant, NAD. Resting tremor of hands. Psych: flat affect. Neuro: Alert and oriented X 3. Moves all extremities spontaneously. HEENT: Normal  Neck: Supple without bruits or JVD. Lungs:  Resp regular and unlabored, CTA. Heart: Irreg, irreg, no s3,  s4, or murmurs. Abdomen: Soft, non-tender, non-distended, BS + x 4.  Extremities: No clubbing, cyanosis or edema. DP/PT/Radials 2+ and equal bilaterally.  Labs   Lab Results  Component Value Date   WBC 9.3 10/22/2017   HGB 14.3 10/22/2017   HCT 43.4 10/22/2017   MCV 94.5 10/22/2017   PLT 250 10/22/2017    Recent Labs  Lab 10/22/17 2111  NA 137  K 3.8  CL 101  CO2 25  BUN 28*  CREATININE 0.61  CALCIUM 9.5  GLUCOSE 119*    Radiology Studies    Dg Chest Portable 1 View  Result Date: 10/22/2017 CLINICAL DATA:  Fall EXAM: PORTABLE CHEST 1 VIEW COMPARISON:  None. FINDINGS: Diffuse interstitial prominence. Calcific aortic atherosclerosis. No focal airspace consolidation. No pleural effusion or pneumothorax. IMPRESSION: Diffuse interstitial prominence may indicate emphysema or mild/early pulmonary edema. No radiographic evidence of pneumonia. Electronically Signed   By: Deatra Robinson M.D.   On: 10/22/2017 22:19   Dg Hip Unilat With Pelvis 2-3 Views Left  Result Date: 10/22/2017 CLINICAL DATA:  Fall EXAM: DG HIP (WITH OR WITHOUT PELVIS) 2-3V LEFT COMPARISON:  None. FINDINGS: There is an acute intertrochanteric fracture of the left femur with medial angulation. The femoral head remains situated in the acetabulum. No pelvic fracture or diastasis. Normal right hip. IMPRESSION: Intertrochanteric left femoral fracture. Electronically Signed   By: Deatra Robinson M.D.   On: 10/22/2017 22:13   ECG & Cardiac Imaging    Significant baseline artifact in setting of resting tremor.  Suspect this is mostly likely  RSR, 95, freq PVCs.  Assessment & Plan    1.  HFpEF/Preop cardiovascular examination:  Pt with prior reported h/o HFpEF and PAH dating back to 05/2016 with reportedly nl EF on echo in 06/2016, admitted following mechanical fall and L hip fx.  She has not required outpt diuretics in over a year and has no history of chest pain, orthopnea, or edema.  Activity is very limited in the setting of COPD and PAH on home O2.  Arthritis also playing a role.  RCRI calculates to 1 resulting in 0.9% risk of major cardiac event with pending hip surgery (IM nailing).  In that setting, she may proceed to surgery without further ischemic testing at this time.  We will try an obtain echo today, but would not delay surgery while waiting for it.  Cont  blocker and statin throughout the perioperative period.  Will have to watch volume status very closely perioperatively - currently euvolemic.  2.  Left Femur Fx:  Per surgery.  To OR today.  See above.  3.  PAH:  Followed by pulmonology as outpt and on revatio.  Cont O2.  Watch volume status.  4.  Essential HTN:  BP currently elevated, though she is uncomfortable/in pain.  Follow on currently meds.  5. HL:  Cont statin.  Signed, Nicolasa Ducking, NP 10/23/2017, 12:28 PM  For questions or updates, please contact   Please consult [email or web address] for contact info under Cardiology/STEMI.

## 2017-10-23 NOTE — OR Nursing (Signed)
Two rings on patients fingers,unable to remove.

## 2017-10-23 NOTE — NC FL2 (Signed)
[place] MEDICAID FL2 LEVEL OF CARE SCREENING TOOL     IDENTIFICATION  Patient Name: Gina Saunders Birthdate: Jan 15, 1935 Sex: female Admission Date (Current Location): 10/22/2017  [place] and [place] Number:  Chiropodist and Address:  [facility], [address] [place], [place] [address]      Provider Number: [id number]  Attending Physician Name and Address:  Shaune Pollack, MD  Relative Name and Phone Number:       Current Level of Care: Hospital Recommended Level of Care: Skilled Nursing Facility Prior Approval Number:    Date Approved/Denied:   PASRR Number: [id number])  Discharge Plan: SNF    Current Diagnoses: Patient Active Problem List   Diagnosis Date Noted  . Closed intertrochanteric fracture of hip, left, initial encounter (HCC) 10/23/2017  . Chronic obstructive pulmonary disease (COPD) (HCC) 10/23/2017  . Pulmonary hypertension (HCC) 10/23/2017  . Essential hypertension 10/23/2017  . HLD (hyperlipidemia) 10/23/2017  . Hypothyroidism 10/23/2017  . GERD (gastroesophageal reflux disease) 10/23/2017  . Vitamin D deficiency 10/23/2017  . Osteoporosis 10/23/2017  . DJD (degenerative joint disease) 10/23/2017  . Tremor 10/23/2017  . Protein-calorie malnutrition, severe 10/23/2017    Orientation RESPIRATION BLADDER Height & Weight     Self, Time, Situation, Place  O2(6 Liters Oxygen. ) Incontinent Weight: 114 lb (51.7 kg) Height:  5\' 5"  (165.1 cm)  BEHAVIORAL SYMPTOMS/MOOD NEUROLOGICAL BOWEL NUTRITION STATUS      Continent Diet(Diet: NPO for surgery to be advanced. )  AMBULATORY STATUS COMMUNICATION OF NEEDS Skin   Extensive Assist Verbally Surgical wounds                       Personal Care Assistance Level of Assistance  Bathing, Feeding, Dressing Bathing Assistance: Limited assistance Feeding assistance: Independent Dressing Assistance: Limited assistance     Functional Limitations Info  Sight, Hearing,  Speech Sight Info: Adequate Hearing Info: Adequate Speech Info: Adequate    SPECIAL CARE FACTORS FREQUENCY  PT (By licensed PT), OT (By licensed OT)     PT Frequency: (5) OT Frequency: (5)            Contractures      Additional Factors Info  Code Status, Allergies Code Status Info: (Full Code. ) Allergies Info: (Codeine)           Current Medications (10/23/2017):  This is the current hospital active medication list Current Facility-Administered Medications  Medication Dose Route Frequency Provider Last Rate Last Dose  . acetaminophen (TYLENOL) tablet 650 mg  650 mg Oral Q6H PRN 12/23/2017, MD      . amLODipine (NORVASC) tablet 5 mg  5 mg Oral Daily Barbaraann Rondo, Prasanna, MD      . bisacodyl (DULCOLAX) EC tablet 5 mg  5 mg Oral Daily PRN Marjie Skiff, MD      . ceFAZolin (ANCEF) IVPB 2g/100 mL premix  2 g Intravenous 30 min Pre-Op Barbaraann Rondo, MD      . famotidine (PEPCID) tablet 20 mg  20 mg Oral Daily Sridharan, Prasanna, MD      . HYDROmorphone (DILAUDID) injection 0.5 mg  0.5 mg Intravenous Q3H PRN Barbaraann Rondo, MD       Or  . HYDROmorphone (DILAUDID) injection 1 mg  1 mg Intravenous Q3H PRN Barbaraann Rondo, MD   1 mg at 10/23/17 1309  . ipratropium-albuterol (DUONEB) 0.5-2.5 (3) MG/3ML nebulizer solution 3 mL  3 mL Nebulization Q6H PRN 12/23/17, MD      .  levothyroxine (SYNTHROID, LEVOTHROID) tablet 88 mcg  88 mcg Oral QAC breakfast Barbaraann Rondo, MD      . lisinopril (PRINIVIL,ZESTRIL) tablet 40 mg  40 mg Oral Daily Barbaraann Rondo, MD      . ondansetron (ZOFRAN) injection 4 mg  4 mg Intravenous Q6H PRN Barbaraann Rondo, MD   4 mg at 10/23/17 1021  . propranolol (INDERAL) tablet 20 mg  20 mg Oral BID Barbaraann Rondo, MD   20 mg at 10/23/17 1309  . senna-docusate (Senokot-S) tablet 1 tablet  1 tablet Oral QHS PRN Barbaraann Rondo, MD      . sertraline (ZOLOFT) tablet 100 mg  100 mg Oral Daily Sridharan,  Prasanna, MD      . sildenafil (REVATIO) tablet 20 mg  20 mg Oral BID Barbaraann Rondo, MD      . simvastatin (ZOCOR) tablet 20 mg  20 mg Oral QHS Barbaraann Rondo, MD         Discharge Medications: Please see discharge summary for a list of discharge medications.  Relevant Imaging Results:  Relevant Lab Results:   Additional Information (SSN: [id number])  Kamree Wiens, Darleen Crocker, LCSW

## 2017-10-23 NOTE — ED Notes (Signed)
Patient transported to 155 

## 2017-10-24 ENCOUNTER — Encounter: Payer: Self-pay | Admitting: Surgery

## 2017-10-24 DIAGNOSIS — E43 Unspecified severe protein-calorie malnutrition: Secondary | ICD-10-CM

## 2017-10-24 DIAGNOSIS — R0603 Acute respiratory distress: Secondary | ICD-10-CM

## 2017-10-24 LAB — CBC WITH DIFFERENTIAL/PLATELET
BASOS PCT: 0 %
Basophils Absolute: 0.1 10*3/uL (ref 0–0.1)
EOS PCT: 1 %
Eosinophils Absolute: 0.2 10*3/uL (ref 0–0.7)
HCT: 37.5 % (ref 35.0–47.0)
HEMOGLOBIN: 12.7 g/dL (ref 12.0–16.0)
Lymphocytes Relative: 7 %
Lymphs Abs: 1.1 10*3/uL (ref 1.0–3.6)
MCH: 32.2 pg (ref 26.0–34.0)
MCHC: 33.9 g/dL (ref 32.0–36.0)
MCV: 95 fL (ref 80.0–100.0)
Monocytes Absolute: 1.1 10*3/uL — ABNORMAL HIGH (ref 0.2–0.9)
Monocytes Relative: 7 %
NEUTROS PCT: 85 %
Neutro Abs: 13.7 10*3/uL — ABNORMAL HIGH (ref 1.4–6.5)
PLATELETS: 181 10*3/uL (ref 150–440)
RBC: 3.95 MIL/uL (ref 3.80–5.20)
RDW: 13.9 % (ref 11.5–14.5)
WBC: 16.1 10*3/uL — AB (ref 3.6–11.0)

## 2017-10-24 LAB — BASIC METABOLIC PANEL
Anion gap: 8 (ref 5–15)
BUN: 38 mg/dL — AB (ref 6–20)
CALCIUM: 9.1 mg/dL (ref 8.9–10.3)
CHLORIDE: 103 mmol/L (ref 101–111)
CO2: 25 mmol/L (ref 22–32)
CREATININE: 0.71 mg/dL (ref 0.44–1.00)
Glucose, Bld: 132 mg/dL — ABNORMAL HIGH (ref 65–99)
Potassium: 3.3 mmol/L — ABNORMAL LOW (ref 3.5–5.1)
Sodium: 136 mmol/L (ref 135–145)

## 2017-10-24 MED ORDER — FUROSEMIDE 10 MG/ML IJ SOLN
20.0000 mg | Freq: Once | INTRAMUSCULAR | Status: AC
  Administered 2017-10-24: 20 mg via INTRAVENOUS
  Filled 2017-10-24: qty 4

## 2017-10-24 MED ORDER — POTASSIUM CHLORIDE 20 MEQ PO PACK
40.0000 meq | PACK | Freq: Two times a day (BID) | ORAL | Status: DC
  Administered 2017-10-24 – 2017-10-27 (×6): 40 meq via ORAL
  Filled 2017-10-24 (×6): qty 2

## 2017-10-24 MED ORDER — POTASSIUM CHLORIDE 20 MEQ PO PACK
20.0000 meq | PACK | Freq: Once | ORAL | Status: AC
  Administered 2017-10-24: 20 meq via ORAL
  Filled 2017-10-24: qty 1

## 2017-10-24 MED ORDER — ALPRAZOLAM 0.25 MG PO TABS
0.2500 mg | ORAL_TABLET | Freq: Two times a day (BID) | ORAL | Status: DC | PRN
  Administered 2017-10-26: 0.25 mg via ORAL
  Filled 2017-10-24 (×2): qty 1

## 2017-10-24 NOTE — Clinical Social Work Note (Signed)
The CSW contacted the patient's daughter, Arline Asp, to provide bed offers. Arline Asp confirmed that she and her mother are choosing [facility] for SNF. The CSW has updated such in the HUB. CSW will continue to follow for discharge facilitation.  Argentina Ponder, MSW, Theresia Majors [phone]

## 2017-10-24 NOTE — Progress Notes (Signed)
  Subjective: 1 Day Post-Op Procedure(s) (LRB): INTRAMEDULLARY (IM) NAIL INTERTROCHANTRIC (Left) Patient reports pain as moderate.   Patient is well but is requiring oxygen due to low O2 sats at this time. PT and care management to assist with discharge planning. Negative for chest pain and shortness of breath Fever: no Gastrointestinal:Negative for nausea and vomiting  Objective: Vital signs in last 24 hours: Temp:  [96.6 F (35.9 C)-98.9 F (37.2 C)] 98.8 F (37.1 C) (05/11 0808) Pulse Rate:  [41-117] 46 (05/11 0808) Resp:  [18-23] 19 (05/11 0808) BP: (124-184)/(59-101) 124/76 (05/11 0808) SpO2:  [89 %-95 %] 92 % (05/11 0808) FiO2 (%):  [50 %] 50 % (05/11 0808)  Intake/Output from previous day:  Intake/Output Summary (Last 24 hours) at 10/24/2017 0932 Last data filed at 10/24/2017 0600 Gross per 24 hour  Intake 692.5 ml  Output 845 ml  Net -152.5 ml    Intake/Output this shift: No intake/output data recorded.  Labs: Recent Labs    10/22/17 2111 10/24/17 0458  HGB 14.3 12.7   Recent Labs    10/22/17 2111 10/24/17 0458  WBC 9.3 16.1*  RBC 4.59 3.95  HCT 43.4 37.5  PLT 250 181   Recent Labs    10/22/17 2111 10/24/17 0458  NA 137 136  K 3.8 3.3*  CL 101 103  CO2 25 25  BUN 28* 38*  CREATININE 0.61 0.71  GLUCOSE 119* 132*  CALCIUM 9.5 9.1   Recent Labs    10/23/17 0347  INR 0.98     EXAM General - Patient is Alert and Appropriate Extremity - ABD soft Sensation intact distally Intact pulses distally Dorsiflexion/Plantar flexion intact Incision: dressing C/D/I Dressing/Incision - clean, dry, no drainage Motor Function - intact, moving foot and toes well on exam.  Abdomen is soft with normal BS.  Past Medical History:  Diagnosis Date  . (HFpEF) heart failure with preserved ejection fraction (HCC)    a. Prev seen by cardiology @ [facility] Arv[facility] Meres) in 05/2016. RHC @ that time revealed PAH (45/15); b. 06/2016 Echo: "diastolic dyfunction"  per PCP note.  . Arthralgia   . Arthritis   . COPD (chronic obstructive pulmonary disease) (HCC)    a. On home O2 @ 4lpm.  . Essential hypertension   . Hyperlipidemia   . Hypertension   . Hypothyroidism   . Impaired glucose tolerance   . Mood disorder (HCC)   . PAH (pulmonary artery hypertension) (HCC)    a. On revatio and home O2.  Followed by pulmonology.  [name] Resting tremor     Assessment/Plan: 1 Day Post-Op Procedure(s) (LRB): INTRAMEDULLARY (IM) NAIL INTERTROCHANTRIC (Left) Active Problems:   Closed intertrochanteric fracture of hip, left, initial encounter (HCC)   Protein-calorie malnutrition, severe  Estimated body mass index is 18.97 kg/m as calculated from the following:   Height as of this encounter: 5\' 5"  (1.651 m).   Weight as of this encounter: 51.7 kg (114 lb). Advance diet Up with therapy Continue to attempt to wean off of O2.  Labs reviewed, WBC 16.1 this AM but no fevers. No signs of infection to the left hip. K+ 3.3, will supplement. Up with therapy today. Begin working on a BM.  DVT Prophylaxis - Lovenox, Foot Pumps and TED hose Weight-Bearing as tolerated to left leg  J. , PA-C [facility] 10/24/2017, 9:32 AM

## 2017-10-24 NOTE — Progress Notes (Signed)
PT Cancellation Note  Patient Details Name: Gina Saunders MRN: [id number] DOB: 1935/03/31   Cancelled Treatment:    Reason Eval/Treat Not Completed: Patient at procedure or test/unavailable.  Order received.  Chart reviewed.  Pt with nursing and receiving pain medication upon PT arrival.  Will re-attempt in 20-30 min.   Glenetta Hew, PT, DPT 10/24/2017, 10:12 AM

## 2017-10-24 NOTE — Progress Notes (Signed)
PT Cancellation Note  Patient Details Name: Gina Saunders MRN: [id number] DOB: Mar 21, 1935   Cancelled Treatment:    Reason Eval/Treat Not Completed: Patient declined, no reason specified.  Pt declined, stating that she is still very nauseated.  PT confirmed with RN who stated that it is appropriate to hold treatment until tomorrow morning.   Glenetta Hew, PT, DPT 10/24/2017, 4:43 PM

## 2017-10-24 NOTE — Evaluation (Signed)
Physical Therapy Evaluation Patient Details Name: Gina Saunders MRN: [id number] DOB: [birth year] Today's Date: 10/24/2017   History of Present Illness  Patient is an 83 year old female admitted from [facility] s/p ORIF of an intertrochanteric fracture of the L hip following a fall in her home.  PMH includes COPD, pulmonary Htn, CHF, heart failure with preserved ejection fraction, Htn, HLD, hypothyroidism, GERD, ostroporosis, DJD and a resting tremor.  Clinical Impression  Pt is an 83 year old female who lives in an ALF alone.  Pt is dependent with ADL's and ambulates with a Rollator at baseline.  She is in bed with her daughter at bedside and appears very lethargic, reporting pain and nausea upon PT arrival.  She presents with overall generalized weakness and reports sensation being intact in bilateral UE's.  PT introduced supine LE HEP and pt completed sets of 5-10 with manual assistance.  She is able to perform a SLR on R side but is apprehensive to initiate on L side.  Pt reported increased nausea during heel slides and requested to defer bed transfer to chair to afternoon.  PT educated pt concerning importance of early mobility following surgery and pt's daughter expressed understanding.  Pt will continue to benefit from skilled PT with focus on strength, functional mobility, pain management, balance and fall prevention and HEP.    Follow Up Recommendations SNF    Equipment Recommendations  None recommended by PT(TBD at next venue of care.)    Recommendations for Other Services       Precautions / Restrictions Restrictions Weight Bearing Restrictions: Yes LLE Weight Bearing: Weight bearing as tolerated      Mobility  Bed Mobility Overal bed mobility: Needs Assistance Bed Mobility: Rolling Rolling: Mod assist         General bed mobility comments: Pt very apprehensive due to pain and experiences increased nausea with slight movement.  Transfers Overall transfer level:  (Did not perform. will assess during afternoon treatment.)                  Ambulation/Gait                Stairs            Wheelchair Mobility    Modified Rankin (Stroke Patients Only)       Balance                                             Pertinent Vitals/Pain Pain Assessment: 0-10 Pain Score: 4  Pain Location: L hip without movement    Home Living Family/patient expects to be discharged to:: Skilled nursing facility                      Prior Function Level of [place]: Needs assistance   Gait / Transfers Assistance Needed: Uses Rollator, household ambulator  ADL's / Homemaking Assistance Needed: Assistance wtih bathing, dressing and meals due to arthritis and balance concerns.        Hand Dominance        Extremity/Trunk Assessment   Upper Extremity Assessment Upper Extremity Assessment: Generalized weakness    Lower Extremity Assessment Lower Extremity Assessment: Generalized weakness    Cervical / Trunk Assessment Cervical / Trunk Assessment: Kyphotic  Communication      Cognition Arousal/Alertness: Lethargic Behavior During Therapy: WFL for tasks assessed/performed  Overall Cognitive Status: Within Functional Limits for tasks assessed                                 General Comments: pt able to follow commands but very lethargic and nauseated.  Pt's daughter states that pt thinks that she is still at [facility].      General Comments      Exercises General Exercises - Lower Extremity Ankle Circles/Pumps: AROM;20 reps;Supine Quad Sets: Strengthening;Left;Right;5 reps;Supine Short Arc Quad: AAROM;Left;5 reps;Supine(Pt's nausea became worse during this exercise.) Heel Slides: Left;5 reps;Supine;AAROM Hip ABduction/ADduction: AAROM;Left;5 reps(Minimal participation) Straight Leg Raises: AAROM;Right;Left;Supine(Pt able to complete SLR of R LE but not L.) Other  Exercises Other Exercises: Issued supine LE exercise HEP and advised pt and daughter concerning exercise frequency and importance of early movement for healing process.   Assessment/Plan    PT Assessment Patient needs continued PT services  PT Problem List Decreased strength;Decreased range of motion;Decreased activity tolerance;Pain       PT Treatment Interventions DME instruction;Functional mobility training;Balance training;Patient/family education;Gait training;Therapeutic activities;Therapeutic exercise    PT Goals (Current goals can be found in the Care Plan section)  Acute Rehab PT Goals PT Goal Formulation: Patient unable to participate in goal setting    Frequency BID   Barriers to discharge        Co-evaluation               AM-PAC PT "6 Clicks" Daily Activity  Outcome Measure Difficulty turning over in bed (including adjusting bedclothes, sheets and blankets)?: A Lot Difficulty moving from lying on back to sitting on the side of the bed? : A Lot Difficulty sitting down on and standing up from a chair with arms (e.g., wheelchair, bedside commode, etc,.)?: A Lot Help needed moving to and from a bed to chair (including a wheelchair)?: A Lot Help needed walking in hospital room?: A Lot Help needed climbing 3-5 steps with a railing? : A Lot 6 Click Score: 12    End of Session Equipment Utilized During Treatment: Oxygen Activity Tolerance: Patient limited by fatigue;Patient limited by lethargy Patient left: in bed;with call bell/phone within reach;with bed alarm set Nurse Communication: Mobility status PT Visit Diagnosis: Muscle weakness (generalized) (M62.81);History of falling (Z91.81)    Time: 1030-1100 PT Time Calculation (min) (ACUTE ONLY): 30 min   Charges:   PT Evaluation $PT Eval Low Complexity: 1 Low PT Treatments $Therapeutic Exercise: 8-22 mins   PT G Codes:   PT G-Codes **NOT FOR INPATIENT CLASS** Functional Assessment Tool Used: AM-PAC 6  Clicks Basic Mobility    Glenetta Hew, PT, DPT   Glenetta Hew 10/24/2017, 11:57 AM

## 2017-10-24 NOTE — Anesthesia Postprocedure Evaluation (Signed)
Anesthesia Post Note  Patient: Gina Saunders  Procedure(s) Performed: INTRAMEDULLARY (IM) NAIL INTERTROCHANTRIC (Left Hip)  Patient location during evaluation: Nursing Unit Anesthesia Type: Spinal Level of consciousness: oriented and awake and alert Pain management: pain level controlled Vital Signs Assessment: post-procedure vital signs reviewed and stable Respiratory status: spontaneous breathing, respiratory function stable and patient connected to nasal cannula oxygen Cardiovascular status: blood pressure returned to baseline and stable Postop Assessment: no headache and no backache Anesthetic complications: no Comments: Patient endorses nausea, which seems to be at her baseline since her admission to the hospital.  Otherwise she is doing well.     Last Vitals:  Vitals:   10/24/17 0808 10/24/17 0945  BP: 124/76   Pulse: (!) 46 85  Resp: 19   Temp: 37.1 C   SpO2: 92%     Last Pain:  Vitals:   10/24/17 0808  TempSrc: Oral  PainSc: 3                  Lenard Simmer

## 2017-10-24 NOTE — Progress Notes (Signed)
Patients o2 drops quickly into the low 80's without O2.  Difficult to maintain 90% with nasal cannula.  Patient was placed on venti mask over night at 50% with 12 L o2

## 2017-10-24 NOTE — Progress Notes (Signed)
[facility] - [place] at [facility]   PATIENT NAME: Gina Saunders    MR#:  [id number]  DATE OF BIRTH:  1934/11/23  SUBJECTIVE:  CHIEF COMPLAINT: Patient is short of breath on Ventimask today.  Daughter at bedside.  Left hip pain is manageable  REVIEW OF SYSTEMS:  CONSTITUTIONAL: No fever, fatigue or weakness.  EYES: No blurred or double vision.  EARS, NOSE, AND THROAT: No tinnitus or ear pain.  RESPIRATORY: shortness of breath is improving, denies wheezing or hemoptysis.  CARDIOVASCULAR: No chest pain, orthopnea, edema.  GASTROINTESTINAL: No nausea, vomiting, diarrhea or abdominal pain.  GENITOURINARY: No dysuria, hematuria.  ENDOCRINE: No polyuria, nocturia,  HEMATOLOGY: No anemia, easy bruising or bleeding SKIN: No rash or lesion. MUSCULOSKELETAL: No joint pain or arthritis.   NEUROLOGIC: No tingling, numbness, weakness.  PSYCHIATRY: No anxiety or depression.   DRUG ALLERGIES:   Allergies  Allergen Reactions  . Codeine     VITALS:  Blood pressure (!) 151/91, pulse 91, temperature 97.7 F (36.5 C), temperature source Oral, resp. rate 18, height 5\' 5"  (1.651 m), weight 51.7 kg (114 lb), SpO2 (!) 79 %.  PHYSICAL EXAMINATION:  GENERAL:  83 y.o.-year-old patient lying in the bed with no acute distress.  EYES: Pupils equal, round, reactive to light and accommodation. No scleral icterus. Extraocular muscles intact.  HEENT: Head atraumatic, normocephalic. Oropharynx and nasopharynx clear.  NECK:  Supple, no jugular venous distention. No thyroid enlargement, no tenderness.  LUNGS: Moderately diminished breath sounds bilaterally, no wheezing, rales,rhonchi or crepitation. No use of accessory muscles of respiration.  CARDIOVASCULAR: S1, S2 normal. No murmurs, rubs, or gallops.  ABDOMEN: Soft, nontender, nondistended. Bowel sounds present. No organomegaly or mass.  EXTREMITIES: Left hip with clean honeycomb dressing no pedal edema, cyanosis, or clubbing.   NEUROLOGIC: Cranial nerves II through XII are intact. Sensation intact. Gait not checked.  PSYCHIATRIC: The patient is alert and oriented x 3.  SKIN: No obvious rash, lesion, or ulcer.    LABORATORY PANEL:   CBC Recent Labs  Lab 10/24/17 0458  WBC 16.1*  HGB 12.7  HCT 37.5  PLT 181   ------------------------------------------------------------------------------------------------------------------  Chemistries  Recent Labs  Lab 10/24/17 0458  NA 136  K 3.3*  CL 103  CO2 25  GLUCOSE 132*  BUN 38*  CREATININE 0.71  CALCIUM 9.1   ------------------------------------------------------------------------------------------------------------------  Cardiac Enzymes No results for input(s): TROPONINI in the last 168 hours. ------------------------------------------------------------------------------------------------------------------  RADIOLOGY:  Dg Chest Portable 1 View  Result Date: 10/22/2017 CLINICAL DATA:  Fall EXAM: PORTABLE CHEST 1 VIEW COMPARISON:  None. FINDINGS: Diffuse interstitial prominence. Calcific aortic atherosclerosis. No focal airspace consolidation. No pleural effusion or pneumothorax. IMPRESSION: Diffuse interstitial prominence may indicate emphysema or mild/early pulmonary edema. No radiographic evidence of pneumonia. Electronically Signed   By: 12/22/2017 M.D.   On: 10/22/2017 22:19   Dg Hip Operative Unilat With Pelvis Left  Result Date: 10/23/2017 CLINICAL DATA:  Internal fixation of left femoral fracture EXAM: OPERATIVE left HIP (WITH PELVIS IF PERFORMED) 4 VIEWS TECHNIQUE: Fluoroscopic spot image(s) were submitted for interpretation post-operatively. COMPARISON:  None. FINDINGS: Four intraoperative fluoroscopic views of the left femur are provided showing placement of an antegrade intramedullary nail at the site of an intertrochanteric fracture. IMPRESSION: Intraoperative fluoroscopy. Electronically Signed   By: 12/23/2017 M.D.   On: 10/23/2017  17:51   Dg Hip Unilat With Pelvis 2-3 Views Left  Result Date: 10/22/2017 CLINICAL DATA:  Fall EXAM: DG HIP (WITH OR WITHOUT  PELVIS) 2-3V LEFT COMPARISON:  None. FINDINGS: There is an acute intertrochanteric fracture of the left femur with medial angulation. The femoral head remains situated in the acetabulum. No pelvic fracture or diastasis. Normal right hip. IMPRESSION: Intertrochanteric left femoral fracture. Electronically Signed   By: Deatra Robinson M.D.   On: 10/22/2017 22:13    EKG:   Orders placed or performed during the hospital encounter of 10/22/17  . EKG 12-Lead  . EKG 12-Lead  . Repeat EKG  . Repeat EKG  . EKG 12-Lead  . EKG 12-Lead  . EKG    ASSESSMENT AND PLAN:   #Acute on chronic hypoxic respiratory failure secondary to pulmonary hypertension and underlying COPD Echocardiogram with severely elevated right heart pressure  continue Ventimask and wean off as tolerated Continue Lasix IV and Revatio p.o. 20 mg twice a day Consult pulmonology Will consider BiPAP if needed   # left intertrochanteric hip fracture postop day #1 Pain management as needed and postop care by orthopedics  #COPD/PHTN: c/w O2, Revatio. DuoNebs PRN.  # HTN/HLD/CHF: c/w Amlodipine, Lisinopril, Propranolol, Zocor.  #  Hypothyroidism: c/w Levothyroxine.  #  GERD: Pepcid   #  Anx: c/w Zoloft.    All the records are reviewed and case discussed with Care Management/Social Workerr. Management plans discussed with the patient, family and they are in agreement.  CODE STATUS: fc   TOTAL TIME TAKING CARE OF THIS PATIENT: 36  minutes.   POSSIBLE D/C IN 2  DAYS, DEPENDING ON CLINICAL CONDITION.  Note: This dictation was prepared with Dragon dictation along with smaller phrase technology. Any transcriptional errors that result from this process are unintentional.   Ramonita Lab M.D on 10/24/2017 at 4:21 PM  Between 7am to 6pm - Pager - [phone] After 6pm go to [email or web address] - password  [email or web address]  [place] [facility]  Office  [phone]  CC: Primary care physician; Ezequiel Kayser, MD

## 2017-10-24 NOTE — Progress Notes (Signed)
Progress Note  Patient Name: Gina Saunders Date of Encounter: 10/24/2017  Primary Cardiologist: Julien Nordmann, MD   Subjective   On full facemask, saturations 85% Receive tramadol for pain, family reports some confusion Feels like she is falling off a chair  Long discussion with family at the bedside, previously for shoulder surgery they use Tylenol  Inpatient Medications    Scheduled Meds: . acetaminophen  1,000 mg Oral Q6H  . amLODipine  5 mg Oral Daily  . docusate sodium  100 mg Oral BID  . enoxaparin (LOVENOX) injection  40 mg Subcutaneous Q24H  . famotidine  20 mg Oral Daily  . furosemide  20 mg Intravenous Once  . levothyroxine  88 mcg Oral QAC breakfast  . lisinopril  40 mg Oral Daily  . potassium chloride  40 mEq Oral BID  . propranolol  20 mg Oral BID  . sertraline  100 mg Oral Daily  . sildenafil  20 mg Oral BID  . simvastatin  20 mg Oral QHS   Continuous Infusions:  PRN Meds: acetaminophen, ALPRAZolam, bisacodyl, bisacodyl, diphenhydrAMINE, HYDROmorphone (DILAUDID) injection **OR** HYDROmorphone (DILAUDID) injection, ipratropium-albuterol, magnesium hydroxide, metoCLOPramide **OR** metoCLOPramide (REGLAN) injection, ondansetron **OR** ondansetron (ZOFRAN) IV, oxyCODONE, senna-docusate, sodium phosphate, traMADol   Vital Signs    Vitals:   10/23/17 2355 10/24/17 0346 10/24/17 0808 10/24/17 0945  BP:  133/62 124/76   Pulse:  64 (!) 46 85  Resp:  18 19   Temp:  98.2 F (36.8 C) 98.8 F (37.1 C)   TempSrc:  Axillary Oral   SpO2: 90% 90% 92%   Weight:      Height:        Intake/Output Summary (Last 24 hours) at 10/24/2017 1510 Last data filed at 10/24/2017 1419 Gross per 24 hour  Intake 692.5 ml  Output 1070 ml  Net -377.5 ml   Filed Weights   10/22/17 2107  Weight: 114 lb (51.7 kg)    Telemetry     normal sinus rhythm- Personally Reviewed  ECG     - Personally Reviewed  Physical Exam   Constitutional:  lethargic, arousable HENT:    Head: Normocephalic and atraumatic.  Eyes:  no discharge. No scleral icterus.  Neck: Normal range of motion. Neck supple. No JVD present.  Cardiovascular: Normal rate, regular rhythm, normal heart sounds and intact distal pulses. Exam reveals no gallop and no friction rub. No edema No murmur heard. Pulmonary/Chest: moderately decreased breath sounds throughout Moderate respiratory distress Scattered wheezes, Rales  Abdominal: Soft.  no distension.  no tenderness.  Musculoskeletal: Normal range of motion.  no  tenderness or deformity.  Neurological:  Not tested Skin: Skin is warm and dry. No rash noted. not diaphoretic.  Psychiatric:  Unable to test, lethargic   Labs    Chemistry Recent Labs  Lab 10/22/17 2111 10/24/17 0458  NA 137 136  K 3.8 3.3*  CL 101 103  CO2 25 25  GLUCOSE 119* 132*  BUN 28* 38*  CREATININE 0.61 0.71  CALCIUM 9.5 9.1  GFRNONAA >60 >60  GFRAA >60 >60  ANIONGAP 11 8     Hematology Recent Labs  Lab 10/22/17 2111 10/24/17 0458  WBC 9.3 16.1*  RBC 4.59 3.95  HGB 14.3 12.7  HCT 43.4 37.5  MCV 94.5 95.0  MCH 31.2 32.2  MCHC 33.0 33.9  RDW 13.9 13.9  PLT 250 181    Cardiac EnzymesNo results for input(s): TROPONINI in the last 168 hours. No results for input(s): TROPIPOC  in the last 168 hours.   BNPNo results for input(s): BNP, PROBNP in the last 168 hours.   DDimer No results for input(s): DDIMER in the last 168 hours.   Radiology    Dg Chest Portable 1 View  Result Date: 10/22/2017 CLINICAL DATA:  Fall EXAM: PORTABLE CHEST 1 VIEW COMPARISON:  None. FINDINGS: Diffuse interstitial prominence. Calcific aortic atherosclerosis. No focal airspace consolidation. No pleural effusion or pneumothorax. IMPRESSION: Diffuse interstitial prominence may indicate emphysema or mild/early pulmonary edema. No radiographic evidence of pneumonia. Electronically Signed   By: Deatra Robinson M.D.   On: 10/22/2017 22:19   Dg Hip Operative Unilat With Pelvis  Left  Result Date: 10/23/2017 CLINICAL DATA:  Internal fixation of left femoral fracture EXAM: OPERATIVE left HIP (WITH PELVIS IF PERFORMED) 4 VIEWS TECHNIQUE: Fluoroscopic spot image(s) were submitted for interpretation post-operatively. COMPARISON:  None. FINDINGS: Four intraoperative fluoroscopic views of the left femur are provided showing placement of an antegrade intramedullary nail at the site of an intertrochanteric fracture. IMPRESSION: Intraoperative fluoroscopy. Electronically Signed   By: Deatra Robinson M.D.   On: 10/23/2017 17:51   Dg Hip Unilat With Pelvis 2-3 Views Left  Result Date: 10/22/2017 CLINICAL DATA:  Fall EXAM: DG HIP (WITH OR WITHOUT PELVIS) 2-3V LEFT COMPARISON:  None. FINDINGS: There is an acute intertrochanteric fracture of the left femur with medial angulation. The femoral head remains situated in the acetabulum. No pelvic fracture or diastasis. Normal right hip. IMPRESSION: Intertrochanteric left femoral fracture. Electronically Signed   By: Deatra Robinson M.D.   On: 10/22/2017 22:13    Cardiac Studies   Echo - Left ventricle: The cavity size was normal. Systolic function was   normal. The estimated ejection fraction was in the range of 60%   to 65%. Wall motion was normal; there were no regional wall   motion abnormalities. Doppler pawhorameters are consistent with   abnormal left ventricular relaxation (grade 1 diastolic   dysfunction). - Mitral valve: There was mild regurgitation. - Left atrium: The atrium was mildly dilated. - Right ventricle: Systolic function was normal. - Tricuspid valve: There was mild-moderate regurgitation. - Pulmonary arteries: Systolic pressure was severely elevated. PA   peak pressure: 82 mm Hg (S).  Patient Profile     83 year old woman with a history of chronic diastolic CHF, ,  coronary hypertension COPD on home oxygen 4 L, resenting to the hospital after a  May 9 suffering left hip fracture.  Assessment & Plan     A/P: 1. pulmonary hypertension In the setting of underlying COPD, Severely elevated right heart pressures on echocardiogram Currently on venti facemask with hypoxia --Would discontinue IV fluids --on revatio 20 twice a day --Lasix 20 mg IV 1 Will replete potassium 40 mg 2, powderas may be unable to swallow a large pills  2.  COPD Long smoking history, no recent COPD exacerbation Typically on 4 L nasal cannula at home. Now on venti mask, likely exacerbated by pulmonary hypertension --BiPAP could be used for hypercarbia and lethargy  3.  Left hip fracture Reduction and internal fixation of displaced intertrochanteric left hip fracture  Performed yesterday  Long discussion with family and nursing concerning above  Total encounter time more than 35 minutes  Greater than 50% was spent in counseling and coordination of care with the patient   For questions or updates, please contact [facility] Please consult [email or web address] for contact info under Cardiology/STEMI.      Signed, Julien Nordmann, MD  10/24/2017, 3:10 PM

## 2017-10-25 ENCOUNTER — Inpatient Hospital Stay: Payer: Medicare Other

## 2017-10-25 DIAGNOSIS — J9601 Acute respiratory failure with hypoxia: Secondary | ICD-10-CM

## 2017-10-25 DIAGNOSIS — J9602 Acute respiratory failure with hypercapnia: Secondary | ICD-10-CM

## 2017-10-25 DIAGNOSIS — I272 Pulmonary hypertension, unspecified: Secondary | ICD-10-CM

## 2017-10-25 DIAGNOSIS — S72142A Displaced intertrochanteric fracture of left femur, initial encounter for closed fracture: Principal | ICD-10-CM

## 2017-10-25 LAB — BASIC METABOLIC PANEL
ANION GAP: 6 (ref 5–15)
BUN: 36 mg/dL — ABNORMAL HIGH (ref 6–20)
CALCIUM: 8.8 mg/dL — AB (ref 8.9–10.3)
CO2: 26 mmol/L (ref 22–32)
Chloride: 105 mmol/L (ref 101–111)
Creatinine, Ser: 0.57 mg/dL (ref 0.44–1.00)
GFR calc Af Amer: >60 mL/min (ref >60)
GFR calc non Af Amer: >60 mL/min (ref >60)
Glucose, Bld: 133 mg/dL — ABNORMAL HIGH (ref 65–99)
Potassium: 3.6 mmol/L (ref 3.5–5.1)
Sodium: 137 mmol/L (ref 135–145)

## 2017-10-25 MED ORDER — CALCIUM CARBONATE ANTACID 500 MG PO CHEW
2.0000 | CHEWABLE_TABLET | Freq: Three times a day (TID) | ORAL | Status: DC | PRN
  Administered 2017-10-25: 400 mg via ORAL
  Filled 2017-10-25: qty 2

## 2017-10-25 MED ORDER — FUROSEMIDE 10 MG/ML IJ SOLN
40.0000 mg | Freq: Once | INTRAMUSCULAR | Status: AC
  Administered 2017-10-25: 40 mg via INTRAVENOUS
  Filled 2017-10-25: qty 4

## 2017-10-25 MED ORDER — IPRATROPIUM-ALBUTEROL 0.5-2.5 (3) MG/3ML IN SOLN
3.0000 mL | Freq: Four times a day (QID) | RESPIRATORY_TRACT | Status: DC
  Administered 2017-10-25 – 2017-10-26 (×6): 3 mL via RESPIRATORY_TRACT
  Filled 2017-10-25 (×6): qty 3

## 2017-10-25 NOTE — Progress Notes (Signed)
Progress Note  Patient Name: Gina Saunders Date of Encounter: 10/25/2017  Primary Cardiologist: Julien Nordmann, MD   Subjective   She has been transitioned from fullface mask to nasal cannula Respiratory status improving,  still not at baseline Reports adequate pain control on Tylenol No family at the bedside 400 cc net negative recorded  Inpatient Medications    Scheduled Meds: . amLODipine  5 mg Oral Daily  . docusate sodium  100 mg Oral BID  . enoxaparin (LOVENOX) injection  40 mg Subcutaneous Q24H  . famotidine  20 mg Oral Daily  . ipratropium-albuterol  3 mL Nebulization Q6H  . levothyroxine  88 mcg Oral QAC breakfast  . lisinopril  40 mg Oral Daily  . potassium chloride  40 mEq Oral BID  . propranolol  20 mg Oral BID  . sertraline  100 mg Oral Daily  . sildenafil  20 mg Oral BID  . simvastatin  20 mg Oral QHS   Continuous Infusions:  PRN Meds: acetaminophen, ALPRAZolam, bisacodyl, bisacodyl, diphenhydrAMINE, magnesium hydroxide, metoCLOPramide **OR** metoCLOPramide (REGLAN) injection, ondansetron **OR** ondansetron (ZOFRAN) IV, oxyCODONE, senna-docusate, sodium phosphate   Vital Signs    Vitals:   10/25/17 0748 10/25/17 0841 10/25/17 1139 10/25/17 1355  BP: (!) 194/94  (!) 143/66   Pulse: 78  70   Resp: 19     Temp: 98.1 F (36.7 C)     TempSrc: Oral     SpO2: 99% 91%  (!) 88%  Weight:      Height:        Intake/Output Summary (Last 24 hours) at 10/25/2017 1415 Last data filed at 10/25/2017 0900 Gross per 24 hour  Intake 360 ml  Output 525 ml  Net -165 ml   Filed Weights   10/22/17 2107  Weight: 114 lb (51.7 kg)    Telemetry     normal sinus rhythm- Personally Reviewed  ECG     - Personally Reviewed  Physical Exam   Constitutional: alert, no distress HENT:  Head: Normocephalic and atraumatic.  Eyes:  no discharge. No scleral icterus.  Neck: Normal range of motion. Neck supple. No JVD present.  Cardiovascular: Normal rate, regular  rhythm, normal heart sounds and intact distal pulses. Exam reveals no gallop and no friction rub. No edema No murmur heard. Pulmonary/Chest: moderately decreased breath sounds throughout Mildly increased work of breathing Scattered wheezes, Rales  Abdominal: Soft.  no distension.  no tenderness.  Musculoskeletal: Normal range of motion.  no  tenderness or deformity.  Neurological:  Not tested Skin: Skin is warm and dry. No rash noted. not diaphoretic.  Psychiatric:  Alert and oriented   Labs    Chemistry Recent Labs  Lab 10/22/17 2111 10/24/17 0458 10/25/17 0515  NA 137 136 137  K 3.8 3.3* 3.6  CL 101 103 105  CO2 25 25 26   GLUCOSE 119* 132* 133*  BUN 28* 38* 36*  CREATININE 0.61 0.71 0.57  CALCIUM 9.5 9.1 8.8*  GFRNONAA >60 >60 >60  GFRAA >60 >60 >60  ANIONGAP 11 8 6      Hematology Recent Labs  Lab 10/22/17 2111 10/24/17 0458  WBC 9.3 16.1*  RBC 4.59 3.95  HGB 14.3 12.7  HCT 43.4 37.5  MCV 94.5 95.0  MCH 31.2 32.2  MCHC 33.0 33.9  RDW 13.9 13.9  PLT 250 181    Cardiac EnzymesNo results for input(s): TROPONINI in the last 168 hours. No results for input(s): TROPIPOC in the last 168 hours.  BNPNo results for input(s): BNP, PROBNP in the last 168 hours.   DDimer No results for input(s): DDIMER in the last 168 hours.   Radiology    Dg Chest Port 1 View  Result Date: 10/25/2017 CLINICAL DATA:  Acute respiratory failure. EXAM: PORTABLE CHEST 1 VIEW COMPARISON:  10/22/2017 FINDINGS: Heart size remains normal. Aortic atherosclerosis as seen previously. Chronic interstitial lung disease appears the same without consolidation or collapse. No effusions. No acute bone finding. IMPRESSION: Chronic interstitial lung disease with diffuse interstitial markings. No sign of superimposed consolidation or collapse. Electronically Signed   By: Paulina Fusi M.D.   On: 10/25/2017 07:51   Dg Hip Operative Unilat With Pelvis Left  Result Date: 10/23/2017 CLINICAL DATA:   Internal fixation of left femoral fracture EXAM: OPERATIVE left HIP (WITH PELVIS IF PERFORMED) 4 VIEWS TECHNIQUE: Fluoroscopic spot image(s) were submitted for interpretation post-operatively. COMPARISON:  None. FINDINGS: Four intraoperative fluoroscopic views of the left femur are provided showing placement of an antegrade intramedullary nail at the site of an intertrochanteric fracture. IMPRESSION: Intraoperative fluoroscopy. Electronically Signed   By: Deatra Robinson M.D.   On: 10/23/2017 17:51    Cardiac Studies   Echo - Left ventricle: The cavity size was normal. Systolic function was   normal. The estimated ejection fraction was in the range of 60%   to 65%. Wall motion was normal; there were no regional wall   motion abnormalities. Doppler pawhorameters are consistent with   abnormal left ventricular relaxation (grade 1 diastolic   dysfunction). - Mitral valve: There was mild regurgitation. - Left atrium: The atrium was mildly dilated. - Right ventricle: Systolic function was normal. - Tricuspid valve: There was mild-moderate regurgitation. - Pulmonary arteries: Systolic pressure was severely elevated. PA   peak pressure: 82 mm Hg (S).  Patient Profile     83 year old woman with a history of chronic diastolic CHF, ,  coronary hypertension COPD on home oxygen 4 L, resenting to the hospital after a  May 9 suffering left hip fracture.  Assessment & Plan    A/P: 1. pulmonary hypertension In the setting of underlying COPD, Severely elevated right heart pressures on echocardiogram IV Lasix yesterday venti facemask after surgery transition now nasal cannula --on revatio 20 TID --Lasix  IV daily with close monitoring of renal function and blood pressure Would replete potassium aggressively Monitor magnesium --as outpatient could consider therapies for pulmonary hypertension once she has completed rehabilitation --At the time of discharge would send on Lasix daily with  potassium  2.  COPD Long smoking history, no recent COPD exacerbation Typically on 4 L nasal cannula at home. She is back on nasal cannula  3.  Left hip fracture Reduction and internal fixation of displaced intertrochanteric left hip fracture  postop day 2 Will require rehabilitation  Case discussed with hospitalist service  Total encounter time more than 35 minutes  Greater than 50% was spent in counseling and coordination of care with the patient   For questions or updates, please contact [facility] Please consult [email or web address] for contact info under Cardiology/STEMI.      Signed, Julien Nordmann, MD  10/25/2017, 2:15 PM

## 2017-10-25 NOTE — Progress Notes (Signed)
  Subjective: 2 Days Post-Op Procedure(s) (LRB): INTRAMEDULLARY (IM) NAIL INTERTROCHANTRIC (Left) Patient reports pain as moderate.   Patient is well but continues to require oxygen due to history of pulmonary hypertension. Plan is for discharge to SNF when medically stable. Negative for chest pain, chronic shortness of breath. Fever: no Gastrointestinal:Negative for nausea and vomiting  Objective: Vital signs in last 24 hours: Temp:  [97.5 F (36.4 C)-98.6 F (37 C)] 98.1 F (36.7 C) (05/12 0748) Pulse Rate:  [74-91] 78 (05/12 0748) Resp:  [18-19] 19 (05/12 0748) BP: (100-194)/(59-94) 194/94 (05/12 0748) SpO2:  [79 %-99 %] 91 % (05/12 0841)  Intake/Output from previous day:  Intake/Output Summary (Last 24 hours) at 10/25/2017 0959 Last data filed at 10/25/2017 0631 Gross per 24 hour  Intake 120 ml  Output 525 ml  Net -405 ml    Intake/Output this shift: No intake/output data recorded.  Labs: Recent Labs    10/22/17 2111 10/24/17 0458  HGB 14.3 12.7   Recent Labs    10/22/17 2111 10/24/17 0458  WBC 9.3 16.1*  RBC 4.59 3.95  HCT 43.4 37.5  PLT 250 181   Recent Labs    10/24/17 0458 10/25/17 0515  NA 136 137  K 3.3* 3.6  CL 103 105  CO2 25 26  BUN 38* 36*  CREATININE 0.71 0.57  GLUCOSE 132* 133*  CALCIUM 9.1 8.8*   Recent Labs    10/23/17 0347  INR 0.98     EXAM General - Patient is Alert and Appropriate Extremity - ABD soft Sensation intact distally Intact pulses distally Dorsiflexion/Plantar flexion intact Incision: dressing C/D/I Dressing/Incision - clean, dry, no drainage Motor Function - intact, moving foot and toes well on exam.  Abdomen is soft with normal BS.  Past Medical History:  Diagnosis Date  . (HFpEF) heart failure with preserved ejection fraction (HCC)    a. Prev seen by cardiology @ [facility]) in 05/2016. [facility] @ that time revealed PAH (45/15); b. 06/2016 Echo: "diastolic dyfunction" per PCP note.  .  Arthralgia   . Arthritis   . COPD (chronic obstructive pulmonary disease) (HCC)    a. On home O2 @ 4lpm.  . Essential hypertension   . Hyperlipidemia   . Hypertension   . Hypothyroidism   . Impaired glucose tolerance   . Mood disorder (HCC)   . PAH (pulmonary artery hypertension) (HCC)    a. On revatio and home O2.  Followed by pulmonology.  [name] Resting tremor     Assessment/Plan: 2 Days Post-Op Procedure(s) (LRB): INTRAMEDULLARY (IM) NAIL INTERTROCHANTRIC (Left) Active Problems:   Closed intertrochanteric fracture of hip, left, initial encounter (HCC)   Protein-calorie malnutrition, severe  Estimated body mass index is 18.97 kg/m as calculated from the following:   Height as of this encounter: 5\' 5"  (1.651 m).   Weight as of this encounter: 51.7 kg (114 lb). Advance diet Up with therapy  Continue O2. Labs reviewed, K+ 3.6, hypokalemia resolved. No signs of infection to the left hip. Up with therapy today. Begin working on a BM.  DVT Prophylaxis - Lovenox, Foot Pumps and TED hose Weight-Bearing as tolerated to left leg  J. , PA-C [facility] 10/25/2017, 9:59 AM

## 2017-10-25 NOTE — Progress Notes (Signed)
Physical Therapy Treatment Patient Details Name: Gina Saunders MRN: [id number] DOB: 03/01/1935 Today's Date: 10/25/2017    History of Present Illness Patient is an 83 year old female admitted from [facility] s/p ORIF of an intertrochanteric fracture of the L hip following a fall in her home.  PMH includes COPD, pulmonary Htn, CHF, heart failure with preserved ejection fraction, Htn, HLD, hypothyroidism, GERD, ostroporosis, DJD and a resting tremor.    PT Comments    Discussed with primary nurse before and after session.  Participated in exercises as described below. Attempted bed mobility but it was deferred due to lethargy, O2 sats and pt's inability to assist.  Overall pain was decreased today with little to no increase with exercises.  O2 monitored throughout session.  O2 varries 86-90% on 4 lpm.     Follow Up Recommendations  SNF     Equipment Recommendations  None recommended by PT    Recommendations for Other Services       Precautions / Restrictions Restrictions Weight Bearing Restrictions: Yes LLE Weight Bearing: Weight bearing as tolerated    Mobility  Bed Mobility               General bed mobility comments: attempted EOB but dependant.  Deferred further due to lethargy  Transfers                    Ambulation/Gait                 Stairs             Wheelchair Mobility    Modified Rankin (Stroke Patients Only)       Balance                                            Cognition Arousal/Alertness: Lethargic Behavior During Therapy: WFL for tasks assessed/performed Overall Cognitive Status: Within Functional Limits for tasks assessed                                        Exercises Other Exercises Other Exercises: BLE P/AAROM for ankle pumps, heel slides, SLR, ab/add 2 x 10 with little assistance from pt.    General Comments        Pertinent Vitals/Pain Pain Assessment:  Faces Faces Pain Scale: Hurts a little bit Pain Location: L hip without movement Pain Descriptors / Indicators: Sore Pain Intervention(s): Limited activity within patient's tolerance;Monitored during session;Ice applied    Home Living                      Prior Function            PT Goals (current goals can now be found in the care plan section) Progress towards PT goals: Not progressing toward goals - comment    Frequency    BID      PT Plan Current plan remains appropriate    Co-evaluation              AM-PAC PT "6 Clicks" Daily Activity  Outcome Measure  Difficulty turning over in bed (including adjusting bedclothes, sheets and blankets)?: Unable Difficulty moving from lying on back to sitting on the side of the bed? : Unable Difficulty sitting down on and  standing up from a chair with arms (e.g., wheelchair, bedside commode, etc,.)?: Unable Help needed moving to and from a bed to chair (including a wheelchair)?: Total Help needed walking in hospital room?: Total Help needed climbing 3-5 steps with a railing? : Total 6 Click Score: 6    End of Session Equipment Utilized During Treatment: Oxygen Activity Tolerance: Patient limited by fatigue;Patient limited by lethargy Patient left: in bed;with bed alarm set;with call bell/phone within reach Nurse Communication: Other (comment)       Time: 6759-1638 PT Time Calculation (min) (ACUTE ONLY): 18 min  Charges:  $Therapeutic Exercise: 8-22 mins                    G Codes:       Danielle Dess, PTA 10/25/17, 11:44 AM

## 2017-10-25 NOTE — Progress Notes (Signed)
[facility] - [place] at [facility]   PATIENT NAME: Gina Saunders    MR#:  [id number]  DATE OF BIRTH:  1935/05/26  SUBJECTIVE:  Now on [place] oxygen. Having a somewhat better day. Looks fatigued. Hip pain manageable  REVIEW OF SYSTEMS:  CONSTITUTIONAL: No fever, fatigue or weakness.  EYES: No blurred or double vision.  EARS, NOSE, AND THROAT: No tinnitus or ear pain.  RESPIRATORY: shortness of breath is improving, denies wheezing or hemoptysis.  CARDIOVASCULAR: No chest pain, orthopnea, edema.  GASTROINTESTINAL: No nausea, vomiting, diarrhea or abdominal pain.  GENITOURINARY: No dysuria, hematuria.  ENDOCRINE: No polyuria, nocturia,  HEMATOLOGY: No anemia, easy bruising or bleeding SKIN: No rash or lesion. MUSCULOSKELETAL: No joint pain or arthritis.   NEUROLOGIC: No tingling, numbness, weakness.  PSYCHIATRY: No anxiety or depression.   DRUG ALLERGIES:   Allergies  Allergen Reactions  . Codeine     VITALS:  Blood pressure (!) 132/46, pulse 64, temperature 98.1 F (36.7 C), temperature source Oral, resp. rate 18, height 5\' 5"  (1.651 m), weight 51.7 kg (114 lb), SpO2 97 %.  PHYSICAL EXAMINATION:  GENERAL:  83 y.o.-year-old patient lying in the bed with no acute distress.  EYES: Pupils equal, round, reactive to light and accommodation. No scleral icterus. Extraocular muscles intact.  HEENT: Head atraumatic, normocephalic. Oropharynx and nasopharynx clear.  NECK:  Supple, no jugular venous distention. No thyroid enlargement, no tenderness.  LUNGS: Moderately diminished breath sounds bilaterally, no wheezing, rales,rhonchi or crepitation. No use of accessory muscles of respiration.  CARDIOVASCULAR: S1, S2 normal. No murmurs, rubs, or gallops.  ABDOMEN: Soft, nontender, nondistended. Bowel sounds present. No organomegaly or mass.  EXTREMITIES: Left hip with clean honeycomb dressing no pedal edema, cyanosis, or clubbing.  NEUROLOGIC: Cranial nerves II through  XII are intact. Sensation intact. Gait not checked.  PSYCHIATRIC: The patient is alert and oriented x 3.  SKIN: No obvious rash, lesion, or ulcer.    LABORATORY PANEL:   CBC Recent Labs  Lab 10/24/17 0458  WBC 16.1*  HGB 12.7  HCT 37.5  PLT 181   ------------------------------------------------------------------------------------------------------------------  Chemistries  Recent Labs  Lab 10/25/17 0515  NA 137  K 3.6  CL 105  CO2 26  GLUCOSE 133*  BUN 36*  CREATININE 0.57  CALCIUM 8.8*   ------------------------------------------------------------------------------------------------------------------  Cardiac Enzymes No results for input(s): TROPONINI in the last 168 hours. ------------------------------------------------------------------------------------------------------------------  RADIOLOGY:  Dg Chest Port 1 View  Result Date: 10/25/2017 CLINICAL DATA:  Acute respiratory failure. EXAM: PORTABLE CHEST 1 VIEW COMPARISON:  10/22/2017 FINDINGS: Heart size remains normal. Aortic atherosclerosis as seen previously. Chronic interstitial lung disease appears the same without consolidation or collapse. No effusions. No acute bone finding. IMPRESSION: Chronic interstitial lung disease with diffuse interstitial markings. No sign of superimposed consolidation or collapse. Electronically Signed   By: 12/22/2017 M.D.   On: 10/25/2017 07:51    EKG:   Orders placed or performed during the hospital encounter of 10/22/17  . EKG 12-Lead  . EKG 12-Lead  . Repeat EKG  . Repeat EKG  . EKG 12-Lead  . EKG 12-Lead  . EKG    ASSESSMENT AND PLAN:   #Acute on chronic hypoxic respiratory failure secondary to severe pulmonary hypertension and underlying COPD Echocardiogram with severely elevated right heart pressure  continue Ventimask and wean off as tolerated---now on 6 liter [place] Continue Lasix IV and Revatio p.o. 20 mg twice a day Consult pulmonology if not any better Will  consider BiPAP  if needed  # left intertrochanteric hip fracture postop day #2 Pain management as needed and postop care by orthopedics  #COPD/PHTN: c/w O2, Revatio. DuoNebs PRN.  # HTN/HLD/CHF: c/w Amlodipine, Lisinopril, Propranolol, Zocor.  #  Hypothyroidism: c/w Levothyroxine.  #  GERD: Pepcid   #  Anx: c/w Zoloft.  All the records are reviewed and case discussed with Care Management/Social Workerr. Management plans discussed with the patient, family and they are in agreement.  CODE STATUS: full ocde   TOTAL TIME TAKING CARE OF THIS PATIENT: 25  minutes.   POSSIBLE D/C IN 1- 2  DAYS, DEPENDING ON CLINICAL CONDITION.  Note: This dictation was prepared with Dragon dictation along with smaller phrase technology. Any transcriptional errors that result from this process are unintentional.   Enedina Finner M.D on 10/25/2017 at 8:08 PM  Between 7am to 6pm - Pager - [phone] After 6pm go to [email or web address] - password [email or web address] [facility]  [place] [facility]  Office  [phone]  CC: Primary care physician; Ezequiel Kayser, MD

## 2017-10-25 NOTE — Consult Note (Signed)
PULMONARY / CRITICAL CARE MEDICINE   Name: Gina Saunders MRN: [id number] DOB: Jan 10, 1935    ADMISSION DATE:  10/22/2017   CONSULTATION DATE: 10/24/2017  REFERRING MD: Dr. Amado Coe  Reason: Worsening hypoxia  HISTORY OF PRESENT ILLNESS:   This is an 83 year old female with a known history of pulmonary hypertension, COPD, hypertension, hyperlipidemia and recent fall with left hip fracture status post reduction and fixation; postop day 2.  PCCM is consulted for worsening shortness of breath and increased oxygen needs.  Patient is on 4 L nasal cannula at home.  Patient is lying comfortably in bed and offers no complaints.  She reports moderate improvement in her respiratory status with BiPAP overnight.  Per patient's daughter, she has been a little more confused since the surgery.  She is on tramadol for pain.  She states that patient has very low tolerance for medications.  She has been seen by cardiology during this hospitalization and currently on Lasix 20 mg p.o. daily, the Revatio 20 mg twice daily.  He was given an extra dose of Lasix 20 mg IV x1 yesterday.  She denies chest pain, palpitations, nausea vomiting dizziness and headache. Prior to hospitalization, she was not seeing a cardiologist or pulmonologist as her PCP was managing all her medical conditions.  She is not currently under the care of a pulmonary hypertension specialist.  Her last echo was on Oct 23, 2017 and showed an EF of 60 to 65%, grade 1 diastolic heart failure and pulmonary artery peak pressures of 82 mmHg.  I am unable to find a any cardiac catheterization report in her EMR.  PAST MEDICAL HISTORY :  She  has a past medical history of (HFpEF) heart failure with preserved ejection fraction (HCC), Arthralgia, Arthritis, COPD (chronic obstructive pulmonary disease) (HCC), Essential hypertension, Hyperlipidemia, Hypertension, Hypothyroidism, Impaired glucose tolerance, Mood disorder (HCC), PAH (pulmonary artery hypertension) (HCC),  and Resting tremor.  PAST SURGICAL HISTORY: She  has a past surgical history that includes Tonsillectomy and Intramedullary (im) nail intertrochanteric (Left, 10/23/2017).  Allergies  Allergen Reactions  . Codeine     No current facility-administered medications on file prior to encounter.    Current Outpatient Medications on File Prior to Encounter  Medication Sig  . acetaminophen (TYLENOL) 325 MG tablet Take 650 mg by mouth every 6 (six) hours as needed.   [name] amLODipine (NORVASC) 5 MG tablet Take 5 mg by mouth daily.   [name] levothyroxine (SYNTHROID) 88 MCG tablet TAKE 1 TABLET DAILY  . lisinopril (PRINIVIL,ZESTRIL) 40 MG tablet Take 40 mg by mouth daily.   . meloxicam (MOBIC) 15 MG tablet TAKE 1 TABLET DAILY  . propranolol (INDERAL) 20 MG tablet 1 TAB EVERY 12 HOURS  . ranitidine (ZANTAC) 150 MG tablet Take 150 mg by mouth 2 (two) times daily.   . sertraline (ZOLOFT) 100 MG tablet Take 100 mg by mouth daily.   . sildenafil (REVATIO) 20 MG tablet Take 20 mg by mouth 2 (two) times daily.   . simvastatin (ZOCOR) 20 MG tablet TAKE 1 TABLET BY MOUTH AT BEDTIME    FAMILY HISTORY:  Her indicated that her mother is deceased. She indicated that her father is deceased.   SOCIAL HISTORY: She  reports that she has quit smoking. She has never used smokeless tobacco. She reports that she does not drink alcohol or use drugs.  REVIEW OF SYSTEMS:   Constitutional: Negative for fever and chills.  HENT: Negative for congestion and rhinorrhea.  Eyes: Negative for redness and  visual disturbance.  Respiratory: Positive for mild shortness of breath but negative for wheezing.  Cardiovascular: Negative for chest pain and palpitations.  Gastrointestinal: Negative  for nausea , vomiting and abdominal pain and  Loose stools Genitourinary: Negative for dysuria and urgency.  Endocrine: Denies polyuria, polyphagia and heat intolerance Musculoskeletal: Reports mild left hip pain Skin: Negative for pallor  and wound.  Neurological: Negative for dizziness and headaches   VITAL SIGNS: BP (!) 147/74 (BP Location: Right Arm)   Pulse 74   Temp 98.6 F (37 C) (Oral)   Resp 19   Ht 5\' 5"  (1.651 m)   Wt 114 lb (51.7 kg)   SpO2 92%   BMI 18.97 kg/m   HEMODYNAMICS:    VENTILATOR SETTINGS: FiO2 (%):  [50 %] 50 %  INTAKE / OUTPUT: I/O last 3 completed shifts: In: 812.5 [P.O.:120; I.V.:492.5; IV Piggyback:200] Out: 1070 [Urine:1045; Blood:25]  PHYSICAL EXAMINATION: General: Sleeping comfortably Neuro: Alert and oriented x2, follows commands, moves all extremities, no focal deficits HEENT: PERRLA, trachea midline, BiPAP in place Cardiovascular: Pulse regular, S1-S2, no murmur regurg or gallop, no edema Lungs: Bilateral breath sounds with no wheezes or rhonchi, diminished in the bases Abdomen: Nondistended, normal bowel sounds x4 quadrants Musculoskeletal: Left leg immobilized with surgical dressing to the left hip Skin: Warm and dry LABS:  BMET Recent Labs  Lab 10/22/17 2111 10/24/17 0458 10/25/17 0515  NA 137 136 137  K 3.8 3.3* 3.6  CL 101 103 105  CO2 25 25 26   BUN 28* 38* 36*  CREATININE 0.61 0.71 0.57  GLUCOSE 119* 132* 133*    Electrolytes Recent Labs  Lab 10/22/17 2111 10/24/17 0458 10/25/17 0515  CALCIUM 9.5 9.1 8.8*    CBC Recent Labs  Lab 10/22/17 2111 10/24/17 0458  WBC 9.3 16.1*  HGB 14.3 12.7  HCT 43.4 37.5  PLT 250 181    Coag's Recent Labs  Lab 10/23/17 0347  APTT 31  INR 0.98    Sepsis Markers No results for input(s): LATICACIDVEN, PROCALCITON, O2SATVEN in the last 168 hours.  ABG No results for input(s): PHART, PCO2ART, PO2ART in the last 168 hours.  Liver Enzymes No results for input(s): AST, ALT, ALKPHOS, BILITOT, ALBUMIN in the last 168 hours.  Cardiac Enzymes No results for input(s): TROPONINI, PROBNP in the last 168 hours.  Glucose No results for input(s): GLUCAP in the last 168 hours.  Imaging No results found.    Recent Results (from the past 16109 hour(s))  ECHOCARDIOGRAM COMPLETE   Collection Time: 10/23/17  1:37 PM  Result Value   Weight 1,824   Height 65   BP 147/71   Narrative                   *[facility]*                       [address]                        [place], [place] [address]                            [phone]  ------------------------------------------------------------------- Transthoracic Echocardiography  Patient:    Siren, Porrata MR #:       [id number] Study Date: 10/23/2017 Gender:     F Age:        83 Height:  165.1 cm Weight:     51.7 kg BSA:        1.53 m^2 Pt. Status: Room:   ORDERING     Nicolasa Ducking, MD  REFERRING    Nicolasa Ducking, MD  ATTENDING    Shaune Pollack Md  PERFORMING   [facility] [employer]  SONOGRAPHER  Humphrey Rolls, RDCS  ADMITTING    Marjie Skiff, Prasanna  cc:  ------------------------------------------------------------------- LV EF: 60% -   65%  ------------------------------------------------------------------- Indications:      Dyspnea 786.09.  ------------------------------------------------------------------- History:   PMH:  PHTN, HLD, HFpEF.  Chronic obstructive pulmonary disease.  Risk factors:  Hypertension.  ------------------------------------------------------------------- Study Conclusions  - Left ventricle: The cavity size was normal. Systolic function was   normal. The estimated ejection fraction was in the range of 60%   to 65%. Wall motion was normal; there were no regional wall   motion abnormalities. Doppler parameters are consistent with   abnormal left ventricular relaxation (grade 1 diastolic   dysfunction). - Mitral valve: There was mild regurgitation. - Left atrium: The atrium was mildly dilated. - Right ventricle: Systolic function was normal. - Tricuspid valve: There was mild-moderate regurgitation. - Pulmonary arteries: Systolic pressure was severely elevated. PA    peak pressure: 82 mm Hg (S).  Impressions:  - Frequent PVCs.  ------------------------------------------------------------------- Study data:   Study status:  Routine.  Procedure:  - new sentence - Transthoracic echocardiography. Image quality was fair. Transthoracic echocardiography.  M-mode, complete 2D, spectral Doppler, and color Doppler.  Birthdate:  Patient birthdate: 10/15/1934.  Age:  Patient is 83 yr old.  Sex:  Gender: female. BMI: 19 kg/m^2.  Blood pressure:     147/71  Patient status: Inpatient.  Study date:  Study date: 10/23/2017. Study time: 01:01 PM.  Location:  Bedside.  -------------------------------------------------------------------  ------------------------------------------------------------------- Left ventricle:  The cavity size was normal. Systolic function was normal. The estimated ejection fraction was in the range of 60% to 65%. Wall motion was normal; there were no regional wall motion abnormalities. Doppler parameters are consistent with abnormal left ventricular relaxation (grade 1 diastolic dysfunction).  ------------------------------------------------------------------- Aortic valve:   Trileaflet; normal thickness leaflets. Mobility was not restricted.  Doppler:  Transvalvular velocity was within the normal range. There was no stenosis. There was no regurgitation. VTI ratio of LVOT to aortic valve: 0.62. Valve area (VTI): 1.75 cm^2. Indexed valve area (VTI): 1.14 cm^2/m^2. Peak velocity ratio of LVOT to aortic valve: 0.6. Valve area (Vmax): 1.7 cm^2. Indexed valve area (Vmax): 1.11 cm^2/m^2. Mean velocity ratio of LVOT to aortic valve: 0.56. Valve area (Vmean): 1.58 cm^2. Indexed valve area (Vmean): 1.03 cm^2/m^2.    Mean gradient (S): 4 mm Hg. Peak gradient (S): 7 mm Hg.  ------------------------------------------------------------------- Aorta:  Aortic root: The aortic root was normal in  size.  ------------------------------------------------------------------- Mitral valve:   Structurally normal valve.   Mobility was not restricted.  Doppler:  Transvalvular velocity was within the normal range. There was no evidence for stenosis. There was mild regurgitation.    Valve area by pressure half-time: 3.79 cm^2. Indexed valve area by pressure half-time: 2.47 cm^2/m^2. Valve area by continuity equation (using LVOT flow): 2.23 cm^2. Indexed valve area by continuity equation (using LVOT flow): 1.45 cm^2/m^2. Mean gradient (D): 2 mm Hg.  ------------------------------------------------------------------- Left atrium:  The atrium was mildly dilated.  ------------------------------------------------------------------- Right ventricle:  The cavity size was normal. Wall thickness was normal. Systolic function was normal.  ------------------------------------------------------------------- Pulmonic valve:    Doppler:  Transvalvular velocity was within  the normal range. There was no evidence for stenosis.  ------------------------------------------------------------------- Tricuspid valve:   Structurally normal valve.    Doppler: Transvalvular velocity was within the normal range. There was mild-moderate regurgitation.  ------------------------------------------------------------------- Pulmonary artery:   The main pulmonary artery was mildly dilated. Systolic pressure was severely elevated.  ------------------------------------------------------------------- Right atrium:  The atrium was normal in size.  ------------------------------------------------------------------- Pericardium:  There was no pericardial effusion.  ------------------------------------------------------------------- Systemic veins: Inferior vena cava: The vessel was normal in size. The respirophasic diameter changes were in the normal range (>= 50%), consistent with normal central venous  pressure.  ------------------------------------------------------------------- Measurements   Left ventricle                           Value          Reference  LV ID, ED, PLAX chordal          (L)     39.2  mm       43 - 52  LV ID, ES, PLAX chordal                  23.8  mm       23 - 38  LV fx shortening, PLAX chordal           39    %        >=29  LV PW thickness, ED                      11.3  mm       ----------  IVS/LV PW ratio, ED                      0.89           <=1.3  Stroke volume, 2D                        41    ml       ----------  Stroke volume/bsa, 2D                    27    ml/m^2   ----------  LV e&', lateral                           10.1  cm/s     ----------  LV E/e&', lateral                         6.62           ----------  LV e&', medial                            6.74  cm/s     ----------  LV E/e&', medial                          9.93           ----------  LV e&', average                           8.42  cm/s     ----------  LV E/e&', average  7.95           ----------    Ventricular septum                       Value          Reference  IVS thickness, ED                        10.1  mm       ----------    LVOT                                     Value          Reference  LVOT ID, S                               19    mm       ----------  LVOT area                                2.84  cm^2     ----------  LVOT peak velocity, S                    77.6  cm/s     ----------  LVOT mean velocity, S                    49.6  cm/s     ----------  LVOT VTI, S                              14.5  cm       ----------    Aortic valve                             Value          Reference  Aortic valve peak velocity, S            130   cm/s     ----------  Aortic valve mean velocity, S            89.1  cm/s     ----------  Aortic valve VTI, S                      23.5  cm       ----------  Aortic mean gradient, S                  4     mm Hg     ----------  Aortic peak gradient, S                  7     mm Hg    ----------  VTI ratio, LVOT/AV                       0.62           ----------  Aortic valve area, VTI                   1.75  cm^2     ----------  Aortic valve area/bsa, VTI               1.14  cm^2/m^2 ----------  Velocity ratio, peak, LVOT/AV            0.6            ----------  Aortic valve area, peak velocity         1.7   cm^2     ----------  Aortic valve area/bsa, peak              1.11  cm^2/m^2 ----------  velocity  Velocity ratio, mean, LVOT/AV            0.56           ----------  Aortic valve area, mean velocity         1.58  cm^2     ----------  Aortic valve area/bsa, mean              1.03  cm^2/m^2 ----------  velocity    Aorta                                    Value          Reference  Aortic root ID, ED                       34    mm       ----------    Left atrium                              Value          Reference  LA ID, A-P, ES                           41    mm       ----------  LA ID/bsa, A-P                   (H)     2.67  cm/m^2   <=2.2  LA volume, S                             34.3  ml       ----------  LA volume/bsa, S                         22.4  ml/m^2   ----------  LA volume, ES, 1-p A4C                   43.1  ml       ----------  LA volume/bsa, ES, 1-p A4C               28.1  ml/m^2   ----------  LA volume, ES, 1-p A2C                   25.9  ml       ----------  LA volume/bsa, ES, 1-p A2C               16.9  ml/m^2   ----------    Mitral valve  Value          Reference  Mitral E-wave peak velocity              66.9  cm/s     ----------  Mitral A-wave peak velocity              108   cm/s     ----------  Mitral mean velocity, D                  72.3  cm/s     ----------  Mitral deceleration time         (L)     129   ms       150 - 230  Mitral pressure half-time                38    ms       ----------  Mitral mean gradient, D                  2     mm  Hg    ----------  Mitral E/A ratio, peak                   0.7            ----------  Mitral valve area, PHT, DP               3.79  cm^2     ----------  Mitral valve area/bsa, PHT, DP           2.47  cm^2/m^2 ----------  Mitral valve area, LVOT                  2.23  cm^2     ----------  continuity  Mitral valve area/bsa, LVOT              1.45  cm^2/m^2 ----------  continuity  Mitral annulus VTI, D                    18.5  cm       ----------    Pulmonary arteries                       Value          Reference  PA pressure, S, DP               (H)     82    mm Hg    <=30    Tricuspid valve                          Value          Reference  Tricuspid regurg peak velocity           424   cm/s     ----------  Tricuspid peak RV-RA gradient            72    mm Hg    ----------    Right atrium                             Value          Reference  RA ID, S-I, ES, A4C              (H)     54.5  mm  34 - 49  RA area, ES, A4C                 (H)     19.8  cm^2     8.3 - 19.5  RA volume, ES, A/L                       58.2  ml       ----------  RA volume/bsa, ES, A/L                   38    ml/m^2   ----------    Right ventricle                          Value          Reference  RV ID, ED, PLAX                          31.7  mm       19 - 38  RV pressure, S, DP               (H)     82    mm Hg    <=30  RV s&', lateral, S                        11.3  cm/s     ----------    Pulmonic valve                           Value          Reference  Pulmonic valve peak velocity, S          94.8  cm/s     ----------  Pulmonic acceleration time               69    ms       ----------  Legend: (L)  and  (H)  mark values outside specified reference range.  ------------------------------------------------------------------- Prepared and Electronically Authenticated by  Dossie Arbour, MD, [facility] 2019-05-10T17:19:10   *Note: Due to a large number of results and/or encounters for the requested time  period, some results have not been displayed. A complete set of results can be found in Results Review.    DISCUSSION: 83 y/o female admitted with a left hip fracture 2/2 a traumatic fall s/p Reduction and internal fixation; now presenting with increasing oxygen needs.   ASSESSMENT  Severe pulmonary hypertension COPD with mild exacerbation secondary to pulmonary hypertension Diastolic heart failure with preserved EF  PLAN Continue current medications for pulmonary hypertension Start nocturnal BiPAP as tolerated with goal of transitioning to home O2 of 4L [place]  Duoneb Q6H And will benefit from referral to pulmonary hypertension specialist as well as cardiac catheterization for better characterization of heart disease GI and DVT prophylaxis   Recall PCCM if needed  FAMILY  - Updates:  Kailan Carmen S. [facility] ANP-BC Pulmonary and Critical Care Medicine [facility] Pager [phone] or [phone]  NB: This document was prepared using Dragon voice recognition software and may include unintentional dictation errors.   10/25/2017, 6:36 AM

## 2017-10-26 ENCOUNTER — Encounter: Payer: Self-pay | Admitting: Surgery

## 2017-10-26 DIAGNOSIS — R0902 Hypoxemia: Secondary | ICD-10-CM

## 2017-10-26 DIAGNOSIS — J9611 Chronic respiratory failure with hypoxia: Secondary | ICD-10-CM

## 2017-10-26 DIAGNOSIS — I270 Primary pulmonary hypertension: Secondary | ICD-10-CM

## 2017-10-26 DIAGNOSIS — J449 Chronic obstructive pulmonary disease, unspecified: Secondary | ICD-10-CM

## 2017-10-26 LAB — BASIC METABOLIC PANEL
ANION GAP: 7 (ref 5–15)
BUN: 38 mg/dL — ABNORMAL HIGH (ref 6–20)
CALCIUM: 8.7 mg/dL — AB (ref 8.9–10.3)
CO2: 24 mmol/L (ref 22–32)
Chloride: 107 mmol/L (ref 101–111)
Creatinine, Ser: 0.68 mg/dL (ref 0.44–1.00)
GFR calc Af Amer: >60 mL/min (ref >60)
GFR calc non Af Amer: >60 mL/min (ref >60)
GLUCOSE: 120 mg/dL — AB (ref 65–99)
POTASSIUM: 3.9 mmol/L (ref 3.5–5.1)
Sodium: 138 mmol/L (ref 135–145)

## 2017-10-26 LAB — CBC
HCT: 30.2 % — ABNORMAL LOW (ref 35.0–47.0)
Hemoglobin: 10.3 g/dL — ABNORMAL LOW (ref 12.0–16.0)
MCH: 32.1 pg (ref 26.0–34.0)
MCHC: 34.1 g/dL (ref 32.0–36.0)
MCV: 94.4 fL (ref 80.0–100.0)
Platelets: 162 K/uL (ref 150–440)
RBC: 3.2 MIL/uL — ABNORMAL LOW (ref 3.80–5.20)
RDW: 14 % (ref 11.5–14.5)
WBC: 8.5 K/uL (ref 3.6–11.0)

## 2017-10-26 MED ORDER — IPRATROPIUM-ALBUTEROL 0.5-2.5 (3) MG/3ML IN SOLN
3.0000 mL | Freq: Three times a day (TID) | RESPIRATORY_TRACT | Status: DC
  Administered 2017-10-26 – 2017-10-27 (×2): 3 mL via RESPIRATORY_TRACT
  Filled 2017-10-26 (×2): qty 3

## 2017-10-26 MED ORDER — IPRATROPIUM-ALBUTEROL 0.5-2.5 (3) MG/3ML IN SOLN: 3.0000 mL | Freq: Four times a day (QID) | RESPIRATORY_TRACT | Status: DC | PRN

## 2017-10-26 MED ORDER — ALUM & MAG HYDROXIDE-SIMETH 200-200-20 MG/5ML PO SUSP
30.0000 mL | Freq: Four times a day (QID) | ORAL | Status: DC | PRN
  Administered 2017-10-26: 30 mL via ORAL
  Filled 2017-10-26: qty 30

## 2017-10-26 MED ORDER — SIMETHICONE 80 MG PO CHEW
80.0000 mg | CHEWABLE_TABLET | Freq: Four times a day (QID) | ORAL | Status: DC | PRN
  Administered 2017-10-26: 80 mg via ORAL
  Filled 2017-10-26 (×2): qty 1

## 2017-10-26 NOTE — Progress Notes (Addendum)
Subjective: 3 Days Post-Op Procedure(s) (LRB): INTRAMEDULLARY (IM) NAIL INTERTROCHANTRIC (Left) Patient reports pain as moderate.   Patient is well but continues to require oxygen due to history of pulmonary hypertension with underlying COPD. Plan is for discharge to SNF when medically stable. Negative for chest pain Chronic shortness of breath. Fever: no Gastrointestinal:Negative for nausea and vomiting  Objective: Vital signs in last 24 hours: Temp:  [98.4 F (36.9 C)-98.7 F (37.1 C)] 98.4 F (36.9 C) (05/13 0742) Pulse Rate:  [64-74] 67 (05/13 0742) Resp:  [16-31] 18 (05/13 0742) BP: (132-158)/(46-77) 158/77 (05/13 0742) SpO2:  [88 %-97 %] 95 % (05/13 0742) FiO2 (%):  [30 %] 30 % (05/13 0210)  Intake/Output from previous day:  Intake/Output Summary (Last 24 hours) at 10/26/2017 0748 Last data filed at 10/25/2017 1549 Gross per 24 hour  Intake 360 ml  Output -  Net 360 ml    Intake/Output this shift: No intake/output data recorded.  Labs: Recent Labs    10/24/17 0458 10/26/17 0351  HGB 12.7 10.3*   Recent Labs    10/24/17 0458 10/26/17 0351  WBC 16.1* 8.5  RBC 3.95 3.20*  HCT 37.5 30.2*  PLT 181 162   Recent Labs    10/25/17 0515 10/26/17 0351  NA 137 138  K 3.6 3.9  CL 105 107  CO2 26 24  BUN 36* 38*  CREATININE 0.57 0.68  GLUCOSE 133* 120*  CALCIUM 8.8* 8.7*   No results for input(s): LABPT, INR in the last 72 hours.   EXAM General - Patient is Alert and Appropriate Extremity - ABD soft Sensation intact distally Intact pulses distally Dorsiflexion/Plantar flexion intact Incision: dressing C/D/I Dressing/Incision - clean, dry, no drainage Motor Function - intact, moving foot and toes well on exam.  Abdomen is soft with normal BS.  Past Medical History:  Diagnosis Date  . (HFpEF) heart failure with preserved ejection fraction (HCC)    a. Prev seen by cardiology @ [facility] Meres) in 05/2016. RHC @ that time revealed PAH  (45/15); b. 06/2016 Echo: "diastolic dyfunction" per PCP note.  . Arthralgia   . Arthritis   . COPD (chronic obstructive pulmonary disease) (HCC)    a. On home O2 @ 4lpm.  . Essential hypertension   . Hyperlipidemia   . Hypertension   . Hypothyroidism   . Impaired glucose tolerance   . Mood disorder (HCC)   . PAH (pulmonary artery hypertension) (HCC)    a. On revatio and home O2.  Followed by pulmonology.  [name] Resting tremor     Assessment/Plan: 3 Days Post-Op Procedure(s) (LRB): INTRAMEDULLARY (IM) NAIL INTERTROCHANTRIC (Left) Active Problems:   Closed intertrochanteric fracture of hip, left, initial encounter (HCC)   Protein-calorie malnutrition, severe  Estimated body mass index is 18.97 kg/m as calculated from the following:   Height as of this encounter: 5\' 5"  (1.651 m).   Weight as of this encounter: 51.7 kg (114 lb). Advance diet Up with therapy   Continue O2, currently on 7L of O2, typically at 4L at home. Labs reviewed, hypokalemia resolved, Hg 10.3. No signs of infection to the left hip. Up with therapy today. Begin working on a BM.  Proceed with enema if needed today.  Upon discharge from the hospital, staples can be removed by the rehab facility staff on 11/05/17. Follow-up with either Dr. 11/07/17 or Joice Lofts, PA-C with [facility] in 6 weeks for x-rays of the left femur. Discharge on Lovenox-40mg  injected daily for 14  days.  DVT Prophylaxis - Lovenox, Foot Pumps and TED hose Weight-Bearing as tolerated to left leg  J. Horris Latino, PA-C [facility] 10/26/2017, 7:48 AM

## 2017-10-26 NOTE — Progress Notes (Signed)
[facility] - [place] at [facility]   PATIENT NAME: Gina Saunders    MR#:  [id number]  DATE OF BIRTH:  1934-07-20  SUBJECTIVE:  Now on 6 liter [place] oxygen. Having a somewhat better day. Looks fatigued. Hip pain manageable Relative in the room REVIEW OF SYSTEMS:  CONSTITUTIONAL: No fever, fatigue or weakness.  EYES: No blurred or double vision.  EARS, NOSE, AND THROAT: No tinnitus or ear pain.  RESPIRATORY: shortness of breath is improving, denies wheezing or hemoptysis.  CARDIOVASCULAR: No chest pain, orthopnea, edema.  GASTROINTESTINAL: No nausea, vomiting, diarrhea or abdominal pain.  GENITOURINARY: No dysuria, hematuria.  ENDOCRINE: No polyuria, nocturia,  HEMATOLOGY: No anemia, easy bruising or bleeding SKIN: No rash or lesion. MUSCULOSKELETAL: No joint pain or arthritis.   NEUROLOGIC: No tingling, numbness, weakness.  PSYCHIATRY: No anxiety or depression.   DRUG ALLERGIES:   Allergies  Allergen Reactions  . Codeine     VITALS:  Blood pressure (!) 141/98, pulse 69, temperature 98.4 F (36.9 C), resp. rate 18, height 5\' 5"  (1.651 m), weight 51.7 kg (114 lb), SpO2 92 %.  PHYSICAL EXAMINATION:  GENERAL:  83 y.o.-year-old patient lying in the bed with no acute distress.  EYES: Pupils equal, round, reactive to light and accommodation. No scleral icterus. Extraocular muscles intact.  HEENT: Head atraumatic, normocephalic. Oropharynx and nasopharynx clear.  NECK:  Supple, no jugular venous distention. No thyroid enlargement, no tenderness.  LUNGS: Moderately diminished breath sounds bilaterally, no wheezing, rales,rhonchi or crepitation. No use of accessory muscles of respiration.  CARDIOVASCULAR: S1, S2 normal. No murmurs, rubs, or gallops.  ABDOMEN: Soft, nontender, nondistended. Bowel sounds present. No organomegaly or mass.  EXTREMITIES: Left hip with clean honeycomb dressing no pedal edema, cyanosis, or clubbing.  NEUROLOGIC: Cranial nerves II  through XII are intact. Sensation intact. Gait not checked.  PSYCHIATRIC: The patient is alert and oriented x 3.  SKIN: No obvious rash, lesion, or ulcer.    LABORATORY PANEL:   CBC Recent Labs  Lab 10/26/17 0351  WBC 8.5  HGB 10.3*  HCT 30.2*  PLT 162   ------------------------------------------------------------------------------------------------------------------  Chemistries  Recent Labs  Lab 10/26/17 0351  NA 138  K 3.9  CL 107  CO2 24  GLUCOSE 120*  BUN 38*  CREATININE 0.68  CALCIUM 8.7*   ------------------------------------------------------------------------------------------------------------------  Cardiac Enzymes No results for input(s): TROPONINI in the last 168 hours. ------------------------------------------------------------------------------------------------------------------  RADIOLOGY:  Dg Chest Port 1 View  Result Date: 10/25/2017 CLINICAL DATA:  Acute respiratory failure. EXAM: PORTABLE CHEST 1 VIEW COMPARISON:  10/22/2017 FINDINGS: Heart size remains normal. Aortic atherosclerosis as seen previously. Chronic interstitial lung disease appears the same without consolidation or collapse. No effusions. No acute bone finding. IMPRESSION: Chronic interstitial lung disease with diffuse interstitial markings. No sign of superimposed consolidation or collapse. Electronically Signed   By: 12/22/2017 M.D.   On: 10/25/2017 07:51    EKG:   Orders placed or performed during the hospital encounter of 10/22/17  . EKG 12-Lead  . EKG 12-Lead  . Repeat EKG  . Repeat EKG  . EKG 12-Lead  . EKG 12-Lead  . EKG    ASSESSMENT AND PLAN:   #Acute on chronic hypoxic respiratory failure secondary to severe pulmonary hypertension and underlying COPD Echocardiogram with severely elevated right heart pressure  continue Ventimask and wean off as tolerated---now on 6 liter Tiburones recieved  Lasix IV --now giving as needed only cont Revatio p.o. 20 mg twice a  day Consult  pulmonology  With Dr Adriana Reams appreciated BIPAP at nighttime  # left intertrochanteric hip fracture postop day #3 Pain management as needed and postop care by orthopedics  #COPD/PHTN: c/w O2, Revatio. DuoNebs PRN.  # HTN/HLD/CHF: c/w Amlodipine, Lisinopril, Propranolol, Zocor.  #  Hypothyroidism: c/w Levothyroxine.  #  GERD: Pepcid   #  Anxiety: c/w Zoloft.  All the records are reviewed and case discussed with Care Management/Social Workerr. Management plans discussed with the patient, family and they are in agreement.  CODE STATUS: full ocde   TOTAL TIME TAKING CARE OF THIS PATIENT: 25  minutes.   POSSIBLE D/C IN  few  DAYS, DEPENDING ON CLINICAL CONDITION.  Note: This dictation was prepared with Dragon dictation along with smaller phrase technology. Any transcriptional errors that result from this process are unintentional.   Enedina Finner M.D on 10/26/2017 at 3:55 PM  Between 7am to 6pm - Pager - [phone] After 6pm go to [email or web address] - password [email or web address] [facility]  [place] [facility]  Office  [phone]  CC: Primary care physician; Ezequiel Kayser, MD

## 2017-10-26 NOTE — Progress Notes (Signed)
Rick admissions coordinator at [facility] is aware of accepted bed offer.   [employer], LCSW [phone]

## 2017-10-26 NOTE — Progress Notes (Signed)
Physical Therapy Treatment Patient Details Name: Gina Saunders MRN: [id number] DOB: 07-Jun-1935 Today's Date: 10/26/2017    History of Present Illness Patient is an 83 year old female admitted from [facility] s/p ORIF of an intertrochanteric fracture of the L hip following a fall in her home.  PMH includes COPD, pulmonary Htn, CHF, heart failure with preserved ejection fraction, Htn, HLD, hypothyroidism, GERD, ostroporosis, DJD and a resting tremor.    PT Comments    Pt more awake today.  Pt with external catheter that is not effectively working and pt wet.  Participated in exercises as described below.  Transitioned to edge of bed with max a x 1 for bathing and care by nursing.  Remained edge of bed x 5 minutes.  Pt with left lean in sitting.  Corrects to midline with cues and physical assist but will quickly return to left lean.  Daughter stated this is baseline.  After 5 minutes, she was assisted back to supine with max a x 2 to finish bed bath and linen changes with nurse techs.    Pt initially scheduled BID,  Discussed with primary PT and ok'ed to decrease to QD frequency due to tolerance.   Follow Up Recommendations  SNF     Equipment Recommendations  None recommended by PT    Recommendations for Other Services       Precautions / Restrictions Restrictions Weight Bearing Restrictions: No LLE Weight Bearing: Weight bearing as tolerated    Mobility  Bed Mobility Overal bed mobility: Needs Assistance Bed Mobility: Rolling;Supine to Sit;Sit to Supine Rolling: Max assist   Supine to sit: Max assist Sit to supine: Max assist;+2 for physical assistance   General bed mobility comments: more awake today and willing to try EOB  Transfers                 General transfer comment: deferred  Ambulation/Gait             General Gait Details: unable   Stairs             Wheelchair Mobility    Modified Rankin (Stroke Patients Only)        Balance Overall balance assessment: Needs assistance Sitting-balance support: Feet supported Sitting balance-Leahy Scale: Poor Sitting balance - Comments: needs constant assist to remain upright,  Postural control: Left lateral lean     Standing balance comment: deferred                            Cognition Arousal/Alertness: Lethargic Behavior During Therapy: WFL for tasks assessed/performed Overall Cognitive Status: Within Functional Limits for tasks assessed                                        Exercises Other Exercises Other Exercises: BLE P/AAROM for ankle pumps, heel slides, SLR, ab/add x 10 with more particiaption by pt today    General Comments        Pertinent Vitals/Pain Pain Assessment: Faces Faces Pain Scale: Hurts a little bit Pain Location: L hip without movement Pain Descriptors / Indicators: Sore Pain Intervention(s): Limited activity within patient's tolerance;Monitored during session    Home Living                      Prior Function  PT Goals (current goals can now be found in the care plan section) Progress towards PT goals: Progressing toward goals    Frequency    7X/week      PT Plan Current plan remains appropriate    Co-evaluation              AM-PAC PT "6 Clicks" Daily Activity  Outcome Measure  Difficulty turning over in bed (including adjusting bedclothes, sheets and blankets)?: Unable Difficulty moving from lying on back to sitting on the side of the bed? : Unable Difficulty sitting down on and standing up from a chair with arms (e.g., wheelchair, bedside commode, etc,.)?: Unable Help needed moving to and from a bed to chair (including a wheelchair)?: Total Help needed walking in hospital room?: Total Help needed climbing 3-5 steps with a railing? : Total 6 Click Score: 6    End of Session Equipment Utilized During Treatment: Oxygen Activity Tolerance: Patient limited  by fatigue;Patient limited by lethargy Patient left: in bed;with nursing/sitter in room         Time: 7858-8502 PT Time Calculation (min) (ACUTE ONLY): 17 min  Charges:  $Therapeutic Exercise: 8-22 mins                    G Codes:       Danielle Dess, PTA 10/26/17, 10:30 AM

## 2017-10-26 NOTE — Progress Notes (Signed)
[facility] [place] [facility]     Assessment and Plan:  Pulmonary hypertension. -Per family patient has had a heart catheterization in the past, results are unknown. - Recommend continued current medication (sildenafil).  Patient might benefit from titration of therapies but the etiology of her underlying pulmonary hypertension needs to be ascertained. - Patient was previously following at [facility] for pulmonary hypertension, she would like to follow locally, as she no longer lives in the [place] area.  Patient was given my card and asked to follow-up with [place] in the office, at which time we will request her outpatient records.  COPD with acute on chronic hypoxic respiratory failure. -Continue current medications. -Currently the patient is on 6 L of oxygen, will wean this down as tolerated to her normal 4 L. - Patient is asked to continue her normal postoperative care including increasing her activity level.  It is tolerable for her to briefly drop below 88% on oxygen, in order to help increase her functional status and muscle strength. -Patient appears to have significant deconditioning/debility.  Uncertain if respiratory status is the main thing that is preventing her from increasing her activity level or if it is overall debility from multiple causes.  I suspect the latter.  However will need to evaluate this further with a 6-minute walk in the outpatient setting.  Status post left hip fracture postop day 3 intramedullary intertrochanteric nail - Continue usual postop care.  Date: 10/26/2017  MRN# [id number] Gina Saunders 09-22-1934   Gina Saunders is a 83 y.o. old female seen in follow up for chief complaint of  Chief Complaint  Patient presents with  . Hip Injury     HPI:  The patient is a 83 year old female with a history of COPD, maintained on 4 L oxygen, diastolic dysfunction and severe pulmonary hypertension. Currently the patient is on nasal cannula 6 L, being maintained on  BiPAP at night.  She is being maintained on Revatio 20 mg twice daily.  Patient's daughter is at the bedside, she explains that the patient was diagnosed with COPD and pulmonary hypertension as well as congestive heart failure after hospital admission at [facility] in 2017.  She tells me at that time the patient underwent a cardiac catheterization.  Since that time she has been following up with [facility] physician for pulmonary hypertension.  She has been maintained on the current medications for some time, she does not think that her medications for pulmonary hypertension were titrated or changed.  Patient lives in an independent living facility, however she ambulates around her home with a wheeled walker, she usually does not ambulate outside of her room.  Due to her oxygen needs of 4L caregivers and the independent living facility often we will her to her meals at other locations so that her oxygen level does not drop.  Thus she has become very inactive, it is uncertain if she actually developed significant dyspnea on exertion.  Echo 10/23/2017 when EF is 60% severe pulmonary hypertension with peak PA pressure of 82.  Medication:    Current Facility-Administered Medications:  .  acetaminophen (TYLENOL) tablet 325-650 mg, 325-650 mg, Oral, Q6H PRN, Poggi, Excell Seltzer, MD, 650 mg at 10/26/17 0813 .  ALPRAZolam (XANAX) tablet 0.25 mg, 0.25 mg, Oral, BID PRN, Gouru, Aruna, MD .  amLODipine (NORVASC) tablet 5 mg, 5 mg, Oral, Daily, Poggi, Excell Seltzer, MD, 5 mg at 10/26/17 0918 .  bisacodyl (DULCOLAX) EC tablet 5 mg, 5 mg, Oral, Daily PRN,  Poggi, Excell Seltzer, MD, 5 mg at 10/25/17 2207 .  bisacodyl (DULCOLAX) suppository 10 mg, 10 mg, Rectal, Daily PRN, Poggi, Excell Seltzer, MD .  calcium carbonate (TUMS - dosed in mg elemental calcium) chewable tablet 400 mg of elemental calcium, 2 tablet, Oral, TID PRN, Enedina Finner, MD, 400 mg of elemental calcium at 10/25/17 1441 .  diphenhydrAMINE (BENADRYL) 12.5 MG/5ML elixir  12.5-25 mg, 12.5-25 mg, Oral, Q4H PRN, Poggi, Excell Seltzer, MD .  docusate sodium (COLACE) capsule 100 mg, 100 mg, Oral, BID, Poggi, Excell Seltzer, MD, 100 mg at 10/26/17 0918 .  enoxaparin (LOVENOX) injection 40 mg, 40 mg, Subcutaneous, Q24H, Poggi, Excell Seltzer, MD, 40 mg at 10/26/17 8756 .  famotidine (PEPCID) tablet 20 mg, 20 mg, Oral, Daily, Poggi, Excell Seltzer, MD, 20 mg at 10/26/17 0919 .  ipratropium-albuterol (DUONEB) 0.5-2.5 (3) MG/3ML nebulizer solution 3 mL, 3 mL, Nebulization, Q6H, Tukov-Yual, Magdalene S, NP, 3 mL at 10/26/17 0726 .  levothyroxine (SYNTHROID, LEVOTHROID) tablet 88 mcg, 88 mcg, Oral, QAC breakfast, Poggi, Excell Seltzer, MD, 88 mcg at 10/26/17 4332 .  lisinopril (PRINIVIL,ZESTRIL) tablet 40 mg, 40 mg, Oral, Daily, Poggi, Excell Seltzer, MD, 40 mg at 10/26/17 0919 .  magnesium hydroxide (MILK OF MAGNESIA) suspension 30 mL, 30 mL, Oral, Daily PRN, Poggi, Excell Seltzer, MD, 30 mL at 10/26/17 0812 .  metoCLOPramide (REGLAN) tablet 5-10 mg, 5-10 mg, Oral, Q8H PRN **OR** metoCLOPramide (REGLAN) injection 5-10 mg, 5-10 mg, Intravenous, Q8H PRN, Poggi, Excell Seltzer, MD, 10 mg at 10/23/17 2119 .  ondansetron (ZOFRAN) tablet 4 mg, 4 mg, Oral, Q6H PRN **OR** ondansetron (ZOFRAN) injection 4 mg, 4 mg, Intravenous, Q6H PRN, Poggi, Excell Seltzer, MD, 4 mg at 10/26/17 0518 .  oxyCODONE (Oxy IR/ROXICODONE) immediate release tablet 5-10 mg, 5-10 mg, Oral, Q4H PRN, Poggi, Excell Seltzer, MD, 5 mg at 10/24/17 0246 .  potassium chloride (KLOR-CON) packet 40 mEq, 40 mEq, Oral, BID, Antonieta Iba, MD, 40 mEq at 10/26/17 0919 .  propranolol (INDERAL) tablet 20 mg, 20 mg, Oral, BID, Poggi, Excell Seltzer, MD, 20 mg at 10/26/17 0919 .  senna-docusate (Senokot-S) tablet 1 tablet, 1 tablet, Oral, QHS PRN, Poggi, Excell Seltzer, MD .  sertraline (ZOLOFT) tablet 100 mg, 100 mg, Oral, Daily, Poggi, Excell Seltzer, MD, 100 mg at 10/26/17 0918 .  sildenafil (REVATIO) tablet 20 mg, 20 mg, Oral, BID, Poggi, Excell Seltzer, MD, 20 mg at 10/25/17 2207 .  simvastatin (ZOCOR) tablet 20 mg, 20 mg, Oral,  QHS, Poggi, Excell Seltzer, MD, 20 mg at 10/25/17 2208 .  sodium phosphate (FLEET) 7-19 GM/118ML enema 1 enema, 1 enema, Rectal, Once PRN, Poggi, Excell Seltzer, MD   Allergies:  Codeine  Review of Systems: Gen:  Denies  fever, sweats. HEENT: Denies blurred vision. Cvc:  No dizziness, chest pain or heaviness Resp:   Denies cough or sputum porduction. Gi: Denies swallowing difficulty, stomach pain. constipation, bowel incontinence Gu:  Denies bladder incontinence, burning urine Ext:   No Joint pain, stiffness. Skin: No skin rash, easy bruising. Endoc:  No polyuria, polydipsia. Psych: No depression, insomnia. Other:  All other systems were reviewed and found to be negative other than what is mentioned in the HPI.   Physical Examination:   VS: BP (!) 141/98   Pulse 69   Temp 98.4 F (36.9 C)   Resp 18   Ht 5\' 5"  (1.651 m)   Wt 114 lb (51.7 kg)   SpO2 95%   BMI 18.97 kg/m    General Appearance:  No distress  Neuro:without focal findings,  speech normal,  HEENT: PERRLA, EOM intact. Pulmonary: normal breath sounds, No wheezing.   CardiovascularNormal S1,S2.  No m/r/g.   Abdomen: Benign, Soft, non-tender. Renal:  No costovertebral tenderness  GU:  Not performed at this time. Endoc: No evident thyromegaly, no signs of acromegaly. Skin:   warm, no rash. Extremities: normal, no cyanosis, clubbing.   LABORATORY PANEL:   CBC Recent Labs  Lab 10/26/17 0351  WBC 8.5  HGB 10.3*  HCT 30.2*  PLT 162   ------------------------------------------------------------------------------------------------------------------  Chemistries  Recent Labs  Lab 10/26/17 0351  NA 138  K 3.9  CL 107  CO2 24  GLUCOSE 120*  BUN 38*  CREATININE 0.68  CALCIUM 8.7*   ------------------------------------------------------------------------------------------------------------------  Cardiac Enzymes No results for input(s): TROPONINI in the last 168  hours. ------------------------------------------------------------  RADIOLOGY:   No results found for this or any previous visit. No results found for this or any previous visit. ------------------------------------------------------------------------------------------------------------------  Thank  you for allowing [facility] to assist in the care of your patient. Our recommendations are noted above.  Please contact us if we can be of further service.   Wells Guiles, MD.  [facility] Office Number: [phone]  Santiago Glad, M.D.  Billy Fischer, M.D  10/26/2017

## 2017-10-26 NOTE — Progress Notes (Signed)
Pt stated that she does not want to wear BIPAP tonight but will try venti mask if needed. Pt has been weaned to 4lpm [place] with sats above 90%. No shortness of breath noted. Pt has been using incentive spirometry today. Will continue to monitor.

## 2017-10-26 NOTE — Care Management Important Message (Signed)
Copy of signed IM left in patient's room.    

## 2017-10-27 ENCOUNTER — Telehealth: Payer: Self-pay | Admitting: *Deleted

## 2017-10-27 MED ORDER — ALPRAZOLAM 0.25 MG PO TABS: 0.2500 mg | ORAL_TABLET | Freq: Two times a day (BID) | ORAL | 0 refills | Status: AC | PRN

## 2017-10-27 MED ORDER — FUROSEMIDE 40 MG PO TABS: 40.0000 mg | ORAL_TABLET | Freq: Every day | ORAL | 0 refills | Status: DC

## 2017-10-27 MED ORDER — FUROSEMIDE 40 MG PO TABS
40.0000 mg | ORAL_TABLET | Freq: Every day | ORAL | Status: DC
  Administered 2017-10-27: 40 mg via ORAL
  Filled 2017-10-27: qty 1

## 2017-10-27 MED ORDER — IPRATROPIUM-ALBUTEROL 0.5-2.5 (3) MG/3ML IN SOLN: 3.0000 mL | Freq: Four times a day (QID) | RESPIRATORY_TRACT | 0 refills | Status: AC | PRN

## 2017-10-27 MED ORDER — POTASSIUM CHLORIDE 20 MEQ PO PACK: 10.0000 meq | PACK | Freq: Every day | ORAL | 0 refills | Status: AC

## 2017-10-27 MED ORDER — ENOXAPARIN SODIUM 40 MG/0.4ML ~~LOC~~ SOLN: 40.0000 mg | SUBCUTANEOUS | 0 refills | Status: DC

## 2017-10-27 MED ORDER — OXYCODONE HCL 5 MG PO TABS: 5.0000 mg | ORAL_TABLET | Freq: Four times a day (QID) | ORAL | 0 refills | Status: DC | PRN

## 2017-10-27 NOTE — Progress Notes (Signed)
Physical Therapy Treatment Patient Details Name: Gina Saunders MRN: [id number] DOB: Mar 19, 1935 Today's Date: 10/27/2017    History of Present Illness Patient is an 83 year old female admitted from [facility] s/p ORIF of an intertrochanteric fracture of the L hip following a fall in her home.  PMH includes COPD, pulmonary Htn, CHF, heart failure with preserved ejection fraction, Htn, HLD, hypothyroidism, GERD, ostroporosis, DJD and a resting tremor.    PT Comments    Pt is making gradual progress towards goals and is able to achieve buttock clearance during standing attempts this date. Needs heavy cues for anterior translation of weight and still presents with L side leaning. Unable to ambulate or transfer to recliner, however making good progress. Reports nausea this date along with neck stiffness, in L side rotation. Will continue to progress.   Follow Up Recommendations  SNF     Equipment Recommendations  None recommended by PT    Recommendations for Other Services       Precautions / Restrictions Precautions Precautions: Fall Restrictions Weight Bearing Restrictions: Yes LLE Weight Bearing: Weight bearing as tolerated    Mobility  Bed Mobility Overal bed mobility: Needs Assistance Bed Mobility: Supine to Sit     Supine to sit: Max assist Sit to supine: Max assist;+2 for physical assistance   General bed mobility comments: able to initiate movement towards EOB. Placed R hand on bed rail for trunk initiation. Once seated at EOB, heavy L lateral leaning noted needing mod assist to maintain. Worked on weight shifting while seated in ant/post plane.  Transfers Overall transfer level: Needs assistance Equipment used: Rolling walker (2 wheeled) Transfers: Sit to/from Stand Sit to Stand: Max assist         General transfer comment: able to attempt standing 1 time, partial standing with buttock clearance. Unable to fully come to erect posture due to weakness and pain.  Returned back to bed  Ambulation/Gait             General Gait Details: unable   Social research officer, government Rankin (Stroke Patients Only)       Balance                                            Cognition Arousal/Alertness: Awake/alert Behavior During Therapy: WFL for tasks assessed/performed Overall Cognitive Status: Within Functional Limits for tasks assessed                                        Exercises Other Exercises Other Exercises: B LE ther-ex performed x 15 reps including SAQ, hip abd/add, glut squeezes and ankle pumps. Needs mod assist for L LE. Pt also with stiffness in neck with L side gaze. Able to look midline, however unable to look towards R side without pain. Practiced ROM of neck.    General Comments        Pertinent Vitals/Pain Pain Assessment: Faces Faces Pain Scale: Hurts even more Pain Location: L hip without movement Pain Descriptors / Indicators: Sore Pain Intervention(s): Limited activity within patient's tolerance    Home Living  Prior Function            PT Goals (current goals can now be found in the care plan section) Acute Rehab PT Goals Patient Stated Goal: to get stronger PT Goal Formulation: With patient Progress towards PT goals: Progressing toward goals    Frequency    7X/week      PT Plan Current plan remains appropriate    Co-evaluation              AM-PAC PT "6 Clicks" Daily Activity  Outcome Measure  Difficulty turning over in bed (including adjusting bedclothes, sheets and blankets)?: Unable Difficulty moving from lying on back to sitting on the side of the bed? : Unable Difficulty sitting down on and standing up from a chair with arms (e.g., wheelchair, bedside commode, etc,.)?: Unable Help needed moving to and from a bed to chair (including a wheelchair)?: Total Help needed walking in hospital  room?: Total Help needed climbing 3-5 steps with a railing? : Total 6 Click Score: 6    End of Session Equipment Utilized During Treatment: Oxygen Activity Tolerance: Patient limited by pain Patient left: in bed;with bed alarm set;with family/visitor present;with SCD's reapplied Nurse Communication: Mobility status PT Visit Diagnosis: Muscle weakness (generalized) (M62.81);History of falling (Z91.81)     Time: 6659-9357 PT Time Calculation (min) (ACUTE ONLY): 25 min  Charges:  $Therapeutic Exercise: 8-22 mins $Therapeutic Activity: 8-22 mins                    G Codes:       Gina Saunders, PT, DPT [phone]    Gina Saunders 10/27/2017, 11:08 AM

## 2017-10-27 NOTE — Progress Notes (Signed)
Progress Note  Patient Name: Gina Saunders Date of Encounter: 10/27/2017  Primary Cardiologist: Julien Nordmann, MD   Subjective   On nasal cannula oxygen Feeling weak today Going to rehab today Denies significant shortness of breath   Inpatient Medications    Scheduled Meds: . amLODipine  5 mg Oral Daily  . docusate sodium  100 mg Oral BID  . enoxaparin (LOVENOX) injection  40 mg Subcutaneous Q24H  . famotidine  20 mg Oral Daily  . furosemide  40 mg Oral Daily  . ipratropium-albuterol  3 mL Nebulization TID  . levothyroxine  88 mcg Oral QAC breakfast  . lisinopril  40 mg Oral Daily  . potassium chloride  40 mEq Oral BID  . propranolol  20 mg Oral BID  . sertraline  100 mg Oral Daily  . sildenafil  20 mg Oral BID  . simvastatin  20 mg Oral QHS   Continuous Infusions:  PRN Meds: acetaminophen, ALPRAZolam, alum & mag hydroxide-simeth, bisacodyl, bisacodyl, calcium carbonate, diphenhydrAMINE, ipratropium-albuterol, magnesium hydroxide, metoCLOPramide **OR** metoCLOPramide (REGLAN) injection, ondansetron **OR** ondansetron (ZOFRAN) IV, oxyCODONE, senna-docusate, sodium phosphate   Vital Signs    Vitals:   10/27/17 0000 10/27/17 0728 10/27/17 0740 10/27/17 0914  BP: (!) 159/95  (!) 149/88 (!) 172/96  Pulse: 77  76   Resp: 18  18   Temp: 99 F (37.2 C)  98.1 F (36.7 C)   TempSrc: Oral  Oral   SpO2: 91% 90% (!) 88%   Weight:      Height:        Intake/Output Summary (Last 24 hours) at 10/27/2017 1216 Last data filed at 10/27/2017 0456 Gross per 24 hour  Intake 0 ml  Output 1 ml  Net -1 ml   Filed Weights   10/22/17 2107  Weight: 114 lb (51.7 kg)    Telemetry     normal sinus rhythm- Personally Reviewed  ECG     - Personally Reviewed  Physical Exam   Constitutional: alert, no distress supine in bed,  HENT:  Head: Normocephalic and atraumatic.  Eyes:  no discharge. No scleral icterus.  Neck: Normal range of motion. Neck supple. No JVD present.    Cardiovascular: Normal rate, regular rhythm, normal heart sounds and intact distal pulses. Exam reveals no gallop and no friction rub. No edema No murmur heard. Pulmonary/Chest: moderately decreased breath sounds throughout No increased work of breathing Scattered Rales , no significant wheezing  Abdominal: Soft.  no distension.  no tenderness.  Musculoskeletal: Normal range of motion.  no  tenderness or deformity.  Neurological:  Not tested Skin: Skin is warm and dry. No rash noted. not diaphoretic.  Psychiatric:  Alert and oriented   Labs    Chemistry Recent Labs  Lab 10/24/17 0458 10/25/17 0515 10/26/17 0351  NA 136 137 138  K 3.3* 3.6 3.9  CL 103 105 107  CO2 25 26 24   GLUCOSE 132* 133* 120*  BUN 38* 36* 38*  CREATININE 0.71 0.57 0.68  CALCIUM 9.1 8.8* 8.7*  GFRNONAA >60 >60 >60  GFRAA >60 >60 >60  ANIONGAP 8 6 7      Hematology Recent Labs  Lab 10/22/17 2111 10/24/17 0458 10/26/17 0351  WBC 9.3 16.1* 8.5  RBC 4.59 3.95 3.20*  HGB 14.3 12.7 10.3*  HCT 43.4 37.5 30.2*  MCV 94.5 95.0 94.4  MCH 31.2 32.2 32.1  MCHC 33.0 33.9 34.1  RDW 13.9 13.9 14.0  PLT 250 181 162    Cardiac EnzymesNo  results for input(s): TROPONINI in the last 168 hours. No results for input(s): TROPIPOC in the last 168 hours.   BNPNo results for input(s): BNP, PROBNP in the last 168 hours.   DDimer No results for input(s): DDIMER in the last 168 hours.   Radiology    No results found.  Cardiac Studies   Echo - Left ventricle: The cavity size was normal. Systolic function was   normal. The estimated ejection fraction was in the range of 60%   to 65%. Wall motion was normal; there were no regional wall   motion abnormalities. Doppler pawhorameters are consistent with   abnormal left ventricular relaxation (grade 1 diastolic   dysfunction). - Mitral valve: There was mild regurgitation. - Left atrium: The atrium was mildly dilated. - Right ventricle: Systolic function was  normal. - Tricuspid valve: There was mild-moderate regurgitation. - Pulmonary arteries: Systolic pressure was severely elevated. PA   peak pressure: 82 mm Hg (S).  Patient Profile     83 year old woman with a history of chronic diastolic CHF, ,  coronary hypertension COPD on home oxygen 4 L, resenting to the hospital after a  May 9 suffering left hip fracture.  Assessment & Plan    A/P: 1. pulmonary hypertension In the setting of underlying COPD, Severely elevated right heart pressures on echocardiogram --on revatio 20 TID --Lasix 40 daily with potassium BMP in 2 weeks as outpatient  2.  COPD Long smoking history, no recent COPD exacerbation Typically on 4 L nasal cannula at home. She is back on nasal cannula Close to her baseline  3.  Left hip fracture Reduction and internal fixation of displaced intertrochanteric left hip fracture  postop going to rehab today   Case discussed with hospitalist service and with family at the bedside  Total encounter time more than 25 minutes  Greater than 50% was spent in counseling and coordination of care with the patient   For questions or updates, please contact [facility] Please consult [email or web address] for contact info under Cardiology/STEMI.      Signed, Julien Nordmann, MD  10/27/2017, 12:16 PM

## 2017-10-27 NOTE — Clinical Social Work Placement (Signed)
   CLINICAL SOCIAL WORK PLACEMENT  NOTE  Date:  10/27/2017  Patient Details  Name: Gina Saunders MRN: [id number] Date of Birth: 1934/07/17  Clinical Social Work is seeking post-discharge placement for this patient at the Skilled  Nursing Facility level of care (*CSW will initial, date and re-position this form in  chart as items are completed):  Yes   Patient/family provided with [place] Clinical Social Work Department's list of facilities offering this level of care within the geographic area requested by the patient (or if unable, by the patient's family).  Yes   Patient/family informed of their freedom to choose among providers that offer the needed level of care, that participate in Medicare, Medicaid or managed care program needed by the patient, have an available bed and are willing to accept the patient.  Yes   Patient/family informed of [facility]'s ownership interest in [facility] and [facility], as well as of the fact that they are under no obligation to receive care at these facilities.  PASRR submitted to EDS on       PASRR number received on       Existing PASRR number confirmed on 10/23/17     FL2 transmitted to all facilities in geographic area requested by pt/family on 10/23/17     FL2 transmitted to all facilities within larger geographic area on       Patient informed that his/her managed care company has contracts with or will negotiate with certain facilities, including the following:        Yes   Patient/family informed of bed offers received.  Patient [facility] bed at [facility] )     Physician recommends and patient chooses bed at      Patient to be transferred to [facility] ) on 10/27/17.  Patient to be transferred to facility by [facility] )     Patient family notified on 10/27/17 of transfer.  Name of family member notified:  (Patient's daughter Arline Asp is at bedside and aware of D/C today. )     PHYSICIAN       Additional  Comment:    _______________________________________________ Jamice Carreno, Darleen Crocker, LCSW 10/27/2017, 11:40 AM

## 2017-10-27 NOTE — Telephone Encounter (Signed)
-----   Message from Coralee Rud sent at 10/27/2017 11:04 AM EDT ----- Regarding: tcm/ph 5/28 3:30  Eula Listen, PA

## 2017-10-27 NOTE — Progress Notes (Signed)
Patient is medically stable for D/C to [facility] today. Per [facility] admissions coordinator at [facility] patient can come today to room E-8. RN will call report and arrange EMS for transport. Clinical Child psychotherapist (CSW) sent D/C orders to [facility] via [employer]. Patient is aware of above. Patient's daughter Arline Asp is at bedside and aware of above. Please reconsult if future social work needs arise. CSW signing off.   [employer], LCSW [phone]

## 2017-10-27 NOTE — Progress Notes (Signed)
Report called and given to Crystal at at [facility]. EMS called for transport. IV removed. Pt dressed and awaiting transport.

## 2017-10-27 NOTE — Telephone Encounter (Signed)
Patient currently admitted

## 2017-10-27 NOTE — Discharge Summary (Signed)
[facility] [place] [facility]   PATIENT NAME: Gina Saunders    MR#:  [id number]  DATE OF BIRTH:  1935/02/04  DATE OF ADMISSION:  10/22/2017 ADMITTING PHYSICIAN: Barbaraann Rondo, MD  DATE OF DISCHARGE: 10/27/2017  PRIMARY CARE PHYSICIAN: Ezequiel Kayser, MD    ADMISSION DIAGNOSIS:  Closed fracture of left hip, initial encounter (HCC) [S72.002A] Closed intertrochanteric fracture of hip, left, initial encounter (HCC) [S72.142A]  DISCHARGE DIAGNOSIS:  *close into trochanteric fracture of the hip status post surgery *acute on chronic hypoxic respiratory failure secondary to severe pulmonary hypertension and underlying COPD  SECONDARY DIAGNOSIS:   Past Medical History:  Diagnosis Date  . (HFpEF) heart failure with preserved ejection fraction (HCC)    a. Prev seen by cardiology @ [facility] Arvilla Meres) in 05/2016. RHC @ that time revealed PAH (45/15); b. 06/2016 Echo: "diastolic dyfunction" per PCP note.  . Arthralgia   . Arthritis   . COPD (chronic obstructive pulmonary disease) (HCC)    a. On home O2 @ 4lpm.  . Essential hypertension   . Hyperlipidemia   . Hypertension   . Hypothyroidism   . Impaired glucose tolerance   . Mood disorder (HCC)   . PAH (pulmonary artery hypertension) (HCC)    a. On revatio and home O2.  Followed by pulmonology.  [name] Resting tremor     HOSPITAL COURSE:   #Acute on chronic hypoxic respiratory failure secondary to severe pulmonary hypertension and underlying COPD Echocardiogram with severely elevated right heart pressure  continue Ventimask and wean off as tolerated---now on 5 liter [place] received Lasix IV--- change to oral Lasix 40 mg daily and potassium 10 MPQ daily. He was discussed with Dr. Mariah Milling continue Revatio p.o. 20 mg twice a day Consultation with  pulmonology  Dr. Mahalia Longest appreciated. Okay to keep sats 87 to 92% given severe pulmonary hypertension and COPD. Pt will follow-up pulmonology as  outpatient  # left intertrochanteric hip fracture postop day #3 Pain management as needed and postop care by orthopedics DVT prophylaxis with Lovenox for 14 days  #COPD/PHTN: c/w O2, Revatio. DuoNebs PRN.  # HTN/HLD/CHF: c/w Amlodipine, Lisinopril, Propranolol, Zocor.  #  Hypothyroidism: c/w Levothyroxine.  #  GERD: Pepcid   #  Anx: c/w Zoloft.  D/W patient and daughter Arline Asp in the room. Patient feels a little bit tired and sleepy. Explain to patients sooner she goes to rehab better it is to avoid complications and thereby help physical therapy for her left hip. Daughter is in agreement with the same. Patient then agreed and is okay to go to rehab today.   CONSULTS OBTAINED:  Treatment Team:  Poggi, Excell Seltzer, MD Barbaraann Rondo, MD Merwyn Katos, MD Antonieta Iba, MD Tora Kindred, DO  DRUG ALLERGIES:   Allergies  Allergen Reactions  . Codeine     DISCHARGE MEDICATIONS:   Allergies as of 10/27/2017      Reactions   Codeine       Medication List    STOP taking these medications   meloxicam 15 MG tablet Commonly known as:  MOBIC     TAKE these medications   acetaminophen 325 MG tablet Commonly known as:  TYLENOL Take 650 mg by mouth every 6 (six) hours as needed.   ALPRAZolam 0.25 MG tablet Commonly known as:  XANAX Take 1 tablet (0.25 mg total) by mouth 2 (two) times daily as needed for anxiety.   amLODipine 5 MG tablet Commonly known as:  NORVASC  Take 5 mg by mouth daily.   enoxaparin 40 MG/0.4ML injection Commonly known as:  LOVENOX Inject 0.4 mLs (40 mg total) into the skin daily. Start taking on:  10/28/2017   furosemide 40 MG tablet Commonly known as:  LASIX Take 1 tablet (40 mg total) by mouth daily.   ipratropium-albuterol 0.5-2.5 (3) MG/3ML Soln Commonly known as:  DUONEB Take 3 mLs by nebulization every 6 (six) hours as needed.   lisinopril 40 MG tablet Commonly known as:  PRINIVIL,ZESTRIL Take 40 mg by mouth daily.    oxyCODONE 5 MG immediate release tablet Commonly known as:  Oxy IR/ROXICODONE Take 1 tablet (5 mg total) by mouth every 6 (six) hours as needed for moderate pain (pain score 4-6).   potassium chloride 20 MEQ packet Commonly known as:  KLOR-CON Take 10 mEq by mouth daily.   propranolol 20 MG tablet Commonly known as:  INDERAL 1 TAB EVERY 12 HOURS   ranitidine 150 MG tablet Commonly known as:  ZANTAC Take 150 mg by mouth 2 (two) times daily.   sertraline 100 MG tablet Commonly known as:  ZOLOFT Take 100 mg by mouth daily.   sildenafil 20 MG tablet Commonly known as:  REVATIO Take 20 mg by mouth 2 (two) times daily.   simvastatin 20 MG tablet Commonly known as:  ZOCOR TAKE 1 TABLET BY MOUTH AT BEDTIME   SYNTHROID 88 MCG tablet Generic drug:  levothyroxine TAKE 1 TABLET DAILY       If you experience worsening of your admission symptoms, develop shortness of breath, life threatening emergency, suicidal or homicidal thoughts you must seek medical attention immediately by calling 911 or calling your MD immediately  if symptoms less severe.  You Must read complete instructions/literature along with all the possible adverse reactions/side effects for all the Medicines you take and that have been prescribed to you. Take any new Medicines after you have completely understood and accept all the possible adverse reactions/side effects.   Please note  You were cared for by a hospitalist during your hospital stay. If you have any questions about your discharge medications or the care you received while you were in the hospital after you are discharged, you can call the unit and asked to speak with the hospitalist on call if the hospitalist that took care of you is not available. Once you are discharged, your primary care physician will handle any further medical issues. Please note that NO REFILLS for any discharge medications will be authorized once you are discharged, as it is  imperative that you return to your primary care physician (or establish a relationship with a primary care physician if you do not have one) for your aftercare needs so that they can reassess your need for medications and monitor your lab values. Today   SUBJECTIVE   I feel tired. Breathing at baseline. She is on file later nasal cannula oxygen. Daughter Arline Asp in the room  VITAL SIGNS:  Blood pressure (!) 172/96, pulse 76, temperature 98.1 F (36.7 C), temperature source Oral, resp. rate 18, height 5\' 5"  (1.651 m), weight 51.7 kg (114 lb), SpO2 (!) 88 %.  I/O:    Intake/Output Summary (Last 24 hours) at 10/27/2017 1101 Last data filed at 10/27/2017 0456 Gross per 24 hour  Intake 0 ml  Output 1 ml  Net -1 ml    PHYSICAL EXAMINATION:  GENERAL:  83 y.o.-year-old patient lying in the bed with no acute distress. Weak EYES: Pupils equal, round, reactive to  light and accommodation. No scleral icterus. Extraocular muscles intact.  HEENT: Head atraumatic, normocephalic. Oropharynx and nasopharynx clear.  NECK:  Supple, no jugular venous distention. No thyroid enlargement, no tenderness.  LUNGS tested breath sounds bilaterally, no wheezing,bibasilaer rales, no rhonchi or crepitation. No use of accessory muscles of respiration. All of breath sounds CARDIOVASCULAR: S1, S2 normal. No murmurs, rubs, or gallops.  ABDOMEN: Soft, non-tender, non-distended. Bowel sounds present. No organomegaly or mass.  EXTREMITIES: No pedal edema, cyanosis, or clubbing.  NEUROLOGIC: Cranial nerves II through XII are intact. Muscle strength 5/5 in all extremities. Sensation intact. Gait not checked.  PSYCHIATRIC: The patient is alert and oriented x 3.  SKIN: No obvious rash, lesion, or ulcer.   DATA REVIEW:   CBC  Recent Labs  Lab 10/26/17 0351  WBC 8.5  HGB 10.3*  HCT 30.2*  PLT 162    Chemistries  Recent Labs  Lab 10/26/17 0351  NA 138  K 3.9  CL 107  CO2 24  GLUCOSE 120*  BUN 38*  CREATININE  0.68  CALCIUM 8.7*    Microbiology Results   Recent Results (from the past 240 hour(s))  Surgical pcr screen     Status: None   Collection Time: 10/23/17  2:00 AM  Result Value Ref Range Status   MRSA, PCR NEGATIVE NEGATIVE Final   Staphylococcus aureus NEGATIVE NEGATIVE Final    Comment: (NOTE) The Xpert SA Assay (FDA approved for NASAL specimens in patients 84 years of age and older), is one component of a comprehensive surveillance program. It is not intended to diagnose infection nor to guide or monitor treatment. Performed at [facility], [address], [place], [place] [address]     RADIOLOGY:  No results found.   Management plans discussed with the patient, family and they are in agreement.  CODE STATUS:     Code Status Orders  (From admission, onward)        Start     Ordered   10/23/17 0037  Full code  Continuous     10/23/17 0036    Code Status History    This patient has a current code status but no historical code status.    Advance Directive Documentation     Most Recent Value  Type of Advance Directive  Living will  Pre-existing out of facility DNR order (yellow form or pink MOST form)  -  "MOST" Form in Place?  -      TOTAL TIME TAKING CARE OF THIS PATIENT: *40* minutes.    Enedina Finner M.D on 10/27/2017 at 11:01 AM  Between 7am to 6pm - Pager - [phone] After 6pm go to [email or web address] - Social research officer, government  [facility]  Office  [phone]  CC: Primary care physician; Ezequiel Kayser, MD

## 2017-10-27 NOTE — Discharge Instructions (Signed)
Upon discharge from the hospital, staples can be removed by the rehab facility staff on 11/05/17. Follow-up with either Dr. Joice Lofts or Horris Latino, PA-C with [facility] in 6 weeks for x-rays of the left femur.  Use your oxygen as before keep sets 87 to 92% on 4 to 5 L of nasal cannula oxygen nebulizer Q6 PRN

## 2017-10-27 NOTE — Progress Notes (Signed)
At the beginning of the shift, pt was alert and oriented. Around midnight she was intermittently confused and continued throughout the night. Denies pain.  dsg dry and intact.

## 2017-10-28 NOTE — Telephone Encounter (Signed)
Spoke with patients daughter. Patient contacted regarding discharge from [facility] on 10/27/17.  Patient understands to follow up with provider Eula Listen PA on 11/10/17 at 3:30 PM at [facility]. Patient understands discharge instructions? Yes Patient understands medications and regiment? Yes Patient understands to bring all medications to this visit? Yes  Patient is currently at [facility] facility and they have all appointments scheduled with no questions.

## 2017-11-06 ENCOUNTER — Inpatient Hospital Stay: Payer: Medicare Other | Admitting: Internal Medicine

## 2017-11-09 NOTE — Progress Notes (Deleted)
Cardiology Office Note Date:  11/09/2017  Patient ID:  Gina Saunders, Gina Saunders 1934-11-24, MRN [id number] PCP:  Ezequiel Kayser, MD  Cardiologist:  Dr. Mariah Milling, MD  ***refresh   Chief Complaint: Hospital follow up  History of Present Illness: Gina Saunders is a 83 y.o. female with history of HFpEF, PAH in the setting of underlying COPD on home oxygen, OA, HTN, HLD, hypothyroidism, resting tremor and recent left hip fracture in the setting of a mechanical fall who presents for hospital follow up after recent admission to [facility] from 5/10 to 5/14 for mechanical fall leading to left hip fracture s/p surgical repair.   Patient was previously admitted to [facility] in 05/2106 with CHF following a fall at home. Records from that admission are not available, though PCP noted have indicated she was diuresed at that time and underwent RHC that revealed PAH with normal PCWP. In 06/2016, TTE reportedly showed DD. Following that admission, she never followed up with cardiology, though did follow up with pulmonology for [facility] and had been managed with Revatio and home oxygen. She has limited mobility in the setting of OA and chronic DOE. She was recently admitted to [facility] on 10/23/17 with a mechanical fall and was found to have a left hip fracture. TTE on 10/23/2017 showed an EF of 60-65%, no RWMA, Gr1DD, mild MR, mildly dilated LA, RVSF normal, mild ot moderate TR, PASP 82 mmHg. She underwent successful surgical repair. Discharge weight of 114 pounds. She was seen by pulmonology with recommendation to maintain oxygen saturations between 87-92%.   Discharge medications: Medication List    STOP taking these medications   meloxicam 15 MG tablet Commonly known as:  MOBIC     TAKE these medications   acetaminophen 325 MG tablet Commonly known as:  TYLENOL Take 650 mg by mouth every 6 (six) hours as needed.   ALPRAZolam 0.25 MG tablet Commonly known as:  XANAX Take 1 tablet (0.25 mg total) by  mouth 2 (two) times daily as needed for anxiety.   amLODipine 5 MG tablet Commonly known as:  NORVASC Take 5 mg by mouth daily.   enoxaparin 40 MG/0.4ML injection Commonly known as:  LOVENOX Inject 0.4 mLs (40 mg total) into the skin daily. Start taking on:  10/28/2017   furosemide 40 MG tablet Commonly known as:  LASIX Take 1 tablet (40 mg total) by mouth daily.   ipratropium-albuterol 0.5-2.5 (3) MG/3ML Soln Commonly known as:  DUONEB Take 3 mLs by nebulization every 6 (six) hours as needed.   lisinopril 40 MG tablet Commonly known as:  PRINIVIL,ZESTRIL Take 40 mg by mouth daily.   oxyCODONE 5 MG immediate release tablet Commonly known as:  Oxy IR/ROXICODONE Take 1 tablet (5 mg total) by mouth every 6 (six) hours as needed for moderate pain (pain score 4-6).   potassium chloride 20 MEQ packet Commonly known as:  KLOR-CON Take 10 mEq by mouth daily.   propranolol 20 MG tablet Commonly known as:  INDERAL 1 TAB EVERY 12 HOURS   ranitidine 150 MG tablet Commonly known as:  ZANTAC Take 150 mg by mouth 2 (two) times daily.   sertraline 100 MG tablet Commonly known as:  ZOLOFT Take 100 mg by mouth daily.   sildenafil 20 MG tablet Commonly known as:  REVATIO Take 20 mg by mouth 2 (two) times daily.   simvastatin 20 MG tablet Commonly known as:  ZOCOR TAKE 1 TABLET BY MOUTH AT BEDTIME   SYNTHROID 88  MCG tablet Generic drug:  levothyroxine TAKE 1 TABLET DAILY        Past Medical History:  Diagnosis Date  . (HFpEF) heart failure with preserved ejection fraction (HCC)    a. Prev seen by cardiology @ [facility] Arvilla Meres) in 05/2016. RHC @ that time revealed PAH (45/15); b. 06/2016 Echo: "diastolic dyfunction" per PCP note.  . Arthralgia   . Arthritis   . COPD (chronic obstructive pulmonary disease) (HCC)    a. On home O2 @ 4lpm.  . Essential hypertension   . Hyperlipidemia   . Hypertension   . Hypothyroidism   . Impaired glucose tolerance    . Mood disorder (HCC)   . PAH (pulmonary artery hypertension) (HCC)    a. On revatio and home O2.  Followed by pulmonology.  [name] Resting tremor     Past Surgical History:  Procedure Laterality Date  . INTRAMEDULLARY (IM) NAIL INTERTROCHANTERIC Left 10/23/2017   Procedure: INTRAMEDULLARY (IM) NAIL INTERTROCHANTRIC;  Surgeon: Christena Flake, MD;  Location: [facility] ORS;  Service: Orthopedics;  Laterality: Left;  . TONSILLECTOMY      No outpatient medications have been marked as taking for the 11/10/17 encounter (Appointment) with Sondra Barges, PA-C.    Allergies:   Codeine   Social History:  The patient  reports that she has quit smoking. She has never used smokeless tobacco. She reports that she does not drink alcohol or use drugs.   Family History:  The patient's family history includes Heart attack in her father; Heart failure in her mother.  ROS:   ROS   PHYSICAL EXAM: *** VS:  There were no vitals taken for this visit. BMI: There is no height or weight on file to calculate BMI.  Physical Exam   EKG:  Was ordered and interpreted by me today. Shows ***  Recent Labs: 10/26/2017: BUN 38; Creatinine, Ser 0.68; Hemoglobin 10.3; Platelets 162; Potassium 3.9; Sodium 138  No results found for requested labs within last 8760 hours.   CrCl cannot be calculated (Unknown ideal weight.).   Wt Readings from Last 3 Encounters:  10/22/17 114 lb (51.7 kg)     Other studies reviewed: Additional studies/records reviewed today include: summarized above  ASSESSMENT AND PLAN:  1. ***  Disposition: F/u with *** in   Current medicines are reviewed at length with the patient today.  The patient did not have any concerns regarding medicines.  Signed, Eula Listen, PA-C 11/09/2017 10:25 AM     [facility] [address] [place], [place] [address] [phone]

## 2017-11-10 ENCOUNTER — Ambulatory Visit: Payer: Medicare Other | Admitting: Physician Assistant

## 2017-11-10 ENCOUNTER — Inpatient Hospital Stay: Payer: Medicare Other | Admitting: Internal Medicine

## 2017-11-12 ENCOUNTER — Inpatient Hospital Stay: Payer: Medicare Other | Admitting: Internal Medicine

## 2017-12-14 NOTE — Progress Notes (Signed)
[facility]     Assessment and Plan:  Pulmonary hypertension. -Per family patient has had a heart catheterization in the past, results are unknown. - Recommend continued current medication (sildenafil). Given her poor functional status titration of therapies would be difficult and of dubious benefit at this time.She is going to follow up at pulmonary hypertension clinic in [place].  - Patient was previously following at [facility] for pulmonary hypertension.  COPD with acute on chronic hypoxic respiratory failure. -Patient prefers not to use inhalers as they make her jittery.  -Continue your oxygen, advance activity.   Return in about 1 year (around 12/16/2018).    Date: 12/14/2017  MRN# [id number] Gina Saunders May 19, 1935   Gina Saunders is a 84 y.o. old female seen in follow up for chief complaint of  Chief Complaint  Patient presents with  . Hospitalization Follow-up    pt get sob with exertion: she is on 5 L with exertion and 4 L at rest. Pt denies cough, wheezing and or chest tightness.     HPI:  The patient was recently discharged after a hip fracture repair, she is now in rehab and will be going back to her assisted/independent living facility in a few days.  Since getting out of the hospital she feels that breathing is ok, and improved but still is winded. She continues to use oxygen at 5L with exertion, she can walk about 25 feet now, which is less than before.  She is on 4L at rest.  She is currently on no nebulizer or inhalers, she found that the Anoro made her worse. The patient notes that she is nervous often with anxiety, she does not want anything which can make that worse.  She feels that breathing is worse after eating, she denies reflux but she takes zantac bid.  She remains on revatio for [facility], she was followed at [facility] previously but moved here.   **Echo 10/23/2017>>EF is 60% severe pulmonary hypertension with peak PA pressure of  82. **Rheumatoid factor 12/01/16>>334 **CCP 12/01/16>>>250    Medication:    Current Outpatient Medications:  .  acetaminophen (TYLENOL) 325 MG tablet, Take 650 mg by mouth every 6 (six) hours as needed. , Disp: , Rfl:  .  ALPRAZolam (XANAX) 0.25 MG tablet, Take 1 tablet (0.25 mg total) by mouth 2 (two) times daily as needed for anxiety., Disp: 10 tablet, Rfl: 0 .  amLODipine (NORVASC) 5 MG tablet, Take 5 mg by mouth daily. , Disp: , Rfl:  .  enoxaparin (LOVENOX) 40 MG/0.4ML injection, Inject 0.4 mLs (40 mg total) into the skin daily., Disp: 14 Syringe, Rfl: 0 .  furosemide (LASIX) 40 MG tablet, Take 1 tablet (40 mg total) by mouth daily., Disp: 30 tablet, Rfl: 0 .  ipratropium-albuterol (DUONEB) 0.5-2.5 (3) MG/3ML SOLN, Take 3 mLs by nebulization every 6 (six) hours as needed., Disp: 360 mL, Rfl: 0 .  levothyroxine (SYNTHROID) 88 MCG tablet, TAKE 1 TABLET DAILY, Disp: , Rfl:  .  lisinopril (PRINIVIL,ZESTRIL) 40 MG tablet, Take 40 mg by mouth daily. , Disp: , Rfl:  .  oxyCODONE (OXY IR/ROXICODONE) 5 MG immediate release tablet, Take 1 tablet (5 mg total) by mouth every 6 (six) hours as needed for moderate pain (pain score 4-6)., Disp: 20 tablet, Rfl: 0 .  potassium chloride (KLOR-CON) 20 MEQ packet, Take 10 mEq by mouth daily., Disp: 30 tablet, Rfl: 0 .  propranolol (INDERAL) 20 MG tablet, 1 TAB EVERY 12 HOURS, Disp: ,  Rfl:  .  ranitidine (ZANTAC) 150 MG tablet, Take 150 mg by mouth 2 (two) times daily. , Disp: , Rfl:  .  sertraline (ZOLOFT) 100 MG tablet, Take 100 mg by mouth daily. , Disp: , Rfl:  .  sildenafil (REVATIO) 20 MG tablet, Take 20 mg by mouth 2 (two) times daily. , Disp: , Rfl:  .  simvastatin (ZOCOR) 20 MG tablet, TAKE 1 TABLET BY MOUTH AT BEDTIME, Disp: , Rfl:    Allergies:  Codeine  Review of Systems:   Physical Examination:   VS: There were no vitals taken for this visit.     LABORATORY PANEL:   CBC No results for input(s): WBC, HGB, HCT, PLT in the last 168  hours. ------------------------------------------------------------------------------------------------------------------  Chemistries  No results for input(s): NA, K, CL, CO2, GLUCOSE, BUN, CREATININE, CALCIUM, MG, AST, ALT, ALKPHOS, BILITOT in the last 168 hours.  Invalid input(s): GFRCGP ------------------------------------------------------------------------------------------------------------------  Cardiac Enzymes No results for input(s): TROPONINI in the last 168 hours. ------------------------------------------------------------  RADIOLOGY:   No results found for this or any previous visit. No results found for this or any previous visit. ------------------------------------------------------------------------------------------------------------------  Thank  you for allowing [facility] to assist in the care of your patient. Our recommendations are noted above.  Please contact us if we can be of further service.   Wells Guiles, M.D., F.C.C.P.  Board Certified in Internal Medicine, Pulmonary Medicine, Critical Care Medicine, and Sleep Medicine.   Pulmonary and Critical Care Office Number: [phone]  12/14/2017

## 2017-12-15 ENCOUNTER — Ambulatory Visit (INDEPENDENT_AMBULATORY_CARE_PROVIDER_SITE_OTHER): Payer: Medicare Other | Admitting: Internal Medicine

## 2017-12-15 ENCOUNTER — Encounter: Payer: Self-pay | Admitting: Physician Assistant

## 2017-12-15 ENCOUNTER — Encounter: Payer: Self-pay | Admitting: Internal Medicine

## 2017-12-15 ENCOUNTER — Ambulatory Visit (INDEPENDENT_AMBULATORY_CARE_PROVIDER_SITE_OTHER): Payer: Medicare Other | Admitting: Physician Assistant

## 2017-12-15 VITALS — BP 114/60 | HR 61 | Resp 16 | Ht 65.0 in | Wt 115.0 lb

## 2017-12-15 VITALS — BP 110/60 | HR 86 | Ht 65.0 in | Wt 115.0 lb

## 2017-12-15 DIAGNOSIS — I10 Essential (primary) hypertension: Secondary | ICD-10-CM | POA: Diagnosis not present

## 2017-12-15 DIAGNOSIS — I272 Pulmonary hypertension, unspecified: Secondary | ICD-10-CM

## 2017-12-15 DIAGNOSIS — W19XXXS Unspecified fall, sequela: Secondary | ICD-10-CM

## 2017-12-15 DIAGNOSIS — J9611 Chronic respiratory failure with hypoxia: Secondary | ICD-10-CM | POA: Diagnosis not present

## 2017-12-15 DIAGNOSIS — F39 Unspecified mood [affective] disorder: Secondary | ICD-10-CM | POA: Insufficient documentation

## 2017-12-15 DIAGNOSIS — E785 Hyperlipidemia, unspecified: Secondary | ICD-10-CM | POA: Diagnosis not present

## 2017-12-15 DIAGNOSIS — J449 Chronic obstructive pulmonary disease, unspecified: Secondary | ICD-10-CM

## 2017-12-15 DIAGNOSIS — J439 Emphysema, unspecified: Secondary | ICD-10-CM

## 2017-12-15 NOTE — Progress Notes (Signed)
Cardiology Office Note Date:  12/15/2017  Patient ID:  Gina Saunders, Gina Saunders 1935/01/10, MRN [id number] PCP:  Ezequiel Kayser, MD  Cardiologist:  Dr. Mariah Milling, MD    Chief Complaint: Hospital follow up  History of Present Illness: Gina Saunders is a 83 y.o. female with history of chronic diastolic CHF, pulmonary hypertension, chronic respiratory failure with hypoxia in the setting of COPD, HTN, HLD, hypothyroidism, tremor, and frequent falls with recent femoral fracture in 10/2017.   She was previously seen by cardiology in 05/2016, at which time she was admitted to [facility] in [place] with CHF following a fall at home. Records from that admission are not currently available but per PCP notes, she was diuresed and underwent RHC revealing PAH w/ nl PCWP. In 06/2016 echo reportedly showed diastolic dysfunction. She never followed up with cardiology but has been followed by pulmonology for [facility] and has been on Revatio and home O2. Activity at home has been limited in the setting of OA and chronic DOE. She was recently admitted to [facility] in 10/2017 with a mechanical fall leading to a hip fracture. Echo showed an EF of 60-65%, no RWMA, Gr1DD, mild MR, mildly dilated LA, RVSF normal, mild to moderate TR, PASP 82 mmHg. She was diuresed and ultimately discharged on Lasix 40 mg daily and Revatio 20 mg bid.   She comes in today doing well from a cardiac standpoint. She remains on her baseline supplemental oxygen at 4-5 L. She has been complaint with all medications. She does indicate eating excess salt at times. She does not have any orthopnea, early satiety, abdominal fullness, or lower extremity swelling. She is uncertain is she would like to pursue further workup of her chronic illnesses.     Past Medical History:  Diagnosis Date  . (HFpEF) heart failure with preserved ejection fraction (HCC)    a. Prev seen by cardiology @ [facility] Arvilla Meres) in 05/2016. RHC @ that time revealed PAH (45/15); b.  06/2016 Echo: "diastolic dyfunction" per PCP note.  . Arthralgia   . Arthritis   . COPD (chronic obstructive pulmonary disease) (HCC)    a. On home O2 @ 4lpm.  . Essential hypertension   . Hyperlipidemia   . Hypertension   . Hypothyroidism   . Impaired glucose tolerance   . Mood disorder (HCC)   . PAH (pulmonary artery hypertension) (HCC)    a. On revatio and home O2.  Followed by pulmonology.  [name] Resting tremor     Past Surgical History:  Procedure Laterality Date  . INTRAMEDULLARY (IM) NAIL INTERTROCHANTERIC Left 10/23/2017   Procedure: INTRAMEDULLARY (IM) NAIL INTERTROCHANTRIC;  Surgeon: Christena Flake, MD;  Location: [facility] ORS;  Service: Orthopedics;  Laterality: Left;  . TONSILLECTOMY      Current Meds  Medication Sig  . acetaminophen (TYLENOL) 325 MG tablet Take 650 mg by mouth every 6 (six) hours as needed.   . ALPRAZolam (XANAX) 0.25 MG tablet Take 1 tablet (0.25 mg total) by mouth 2 (two) times daily as needed for anxiety.  [name] amLODipine (NORVASC) 5 MG tablet Take 5 mg by mouth daily.   . furosemide (LASIX) 40 MG tablet Take 1 tablet (40 mg total) by mouth daily.  [name] ipratropium-albuterol (DUONEB) 0.5-2.5 (3) MG/3ML SOLN Take 3 mLs by nebulization every 6 (six) hours as needed.  [name] levothyroxine (SYNTHROID) 88 MCG tablet TAKE 1 TABLET DAILY  . lisinopril (PRINIVIL,ZESTRIL) 40 MG tablet Take 40 mg by mouth daily.   [name]  oxyCODONE (OXY IR/ROXICODONE) 5 MG immediate release tablet Take 1 tablet (5 mg total) by mouth every 6 (six) hours as needed for moderate pain (pain score 4-6).  [name] potassium chloride (KLOR-CON) 20 MEQ packet Take 10 mEq by mouth daily.  . propranolol (INDERAL) 20 MG tablet 1 TAB EVERY 12 HOURS  . ranitidine (ZANTAC) 150 MG tablet Take 150 mg by mouth 2 (two) times daily.   . sertraline (ZOLOFT) 100 MG tablet Take 100 mg by mouth daily.   . sildenafil (REVATIO) 20 MG tablet Take 20 mg by mouth 2 (two) times daily.   . simvastatin (ZOCOR) 20 MG tablet TAKE 1 TABLET  BY MOUTH AT BEDTIME    Allergies:   Codeine   Social History:  The patient  reports that she has quit smoking. She has never used smokeless tobacco. She reports that she does not drink alcohol or use drugs.   Family History:  The patient's family history includes Heart attack in her father; Heart failure in her mother.  ROS:   Review of Systems  Constitutional: Positive for malaise/fatigue. Negative for chills, diaphoresis, fever and weight loss.  HENT: Negative for congestion.   Eyes: Negative for discharge and redness.  Respiratory: Positive for shortness of breath. Negative for cough, hemoptysis, sputum production and wheezing.   Cardiovascular: Negative for chest pain, palpitations, orthopnea, claudication, leg swelling and PND.  Gastrointestinal: Negative for abdominal pain, blood in stool, heartburn, melena, nausea and vomiting.  Genitourinary: Negative for hematuria.  Musculoskeletal: Positive for back pain, falls, joint pain and myalgias.  Skin: Negative for rash.  Neurological: Positive for weakness. Negative for dizziness, tingling, tremors, sensory change, speech change, focal weakness and loss of consciousness.  Endo/Heme/Allergies: Does not bruise/bleed easily.  Psychiatric/Behavioral: Negative for substance abuse. The patient is not nervous/anxious.   All other systems reviewed and are negative.    PHYSICAL EXAM:  VS:  BP 110/60 (BP Location: Left Arm, Patient Position: Sitting, Cuff Size: Normal)   Pulse 86   Ht 5\' 5"  (1.651 m)   Wt 115 lb (52.2 kg)   BMI 19.14 kg/m  BMI: Body mass index is 19.14 kg/m.  Physical Exam  Constitutional: She is oriented to person, place, and time. She appears well-developed and well-nourished.  Chronically ill appearing  HENT:  Head: Normocephalic and atraumatic.  Eyes: Right eye exhibits no discharge. Left eye exhibits no discharge.  Neck: Normal range of motion. No JVD present.  Cardiovascular: Normal rate, regular rhythm, S1  normal and S2 normal. Exam reveals no distant heart sounds, no friction rub, no midsystolic click and no opening snap.  Murmur heard. High-pitched blowing holosystolic murmur is present with a grade of 2/6 at the apex. Pulses:      Posterior tibial pulses are 2+ on the right side, and 2+ on the left side.  Pulmonary/Chest: Effort normal. No respiratory distress. She has decreased breath sounds. She has no wheezes. She has rhonchi in the right lower field and the left lower field. She has no rales. She exhibits no tenderness.  Supplemental oxygen noted at baseline 4-5 L via nasal cannula  Abdominal: Soft. She exhibits no distension. There is no tenderness.  Musculoskeletal: She exhibits no edema.  Slight varus deformity of the left foot  Neurological: She is alert and oriented to person, place, and time.  Skin: Skin is warm and dry. No cyanosis. Nails show no clubbing.  Psychiatric: She has a normal mood and affect. Her speech is normal and behavior is normal.  Judgment and thought content normal.     EKG:  Was ordered and interpreted by me today. Shows NSR, 86 bpm, sinus arrhythmia, baseline artifact/tremor, nonspecific st/t changes  Recent Labs: 10/26/2017: BUN 38; Creatinine, Ser 0.68; Hemoglobin 10.3; Platelets 162; Potassium 3.9; Sodium 138  No results found for requested labs within last 8760 hours.   CrCl cannot be calculated (Patient's most recent lab result is older than the maximum 21 days allowed.).   Wt Readings from Last 3 Encounters:  12/15/17 115 lb (52.2 kg)  12/15/17 115 lb (52.2 kg)  10/22/17 114 lb (51.7 kg)     Other studies reviewed: Additional studies/records reviewed today include: summarized above  ASSESSMENT AND PLAN:  1. Chronic respiratory failure with hypoxia: Likely multifactorial including pulmonary hypertension and COPD. Stable on supplemental oxygen at 4-5 L via nasal cannula.   2. Pulmonary hypertension: She reports a RHC in the past, results are not  available. She has been managed on lasix and Revatio since her admission in 10/2017. She agrees to be evaluated by the advanced heart failure clinic. Attempt to obtain prior records from [place].    3. COPD: Seeing pulmonology today. Driving force of her pulmonary hypertension.   4. HTN: Blood pressure is well controlled today. Continue current medications.   5. HLD: Continue simvastatin.   6. Falls: Continuing to work with PT.   Disposition: F/u with Dr. Mariah Milling in 3 months. Referral to the advanced heart failure clinic in [place], [place].   Current medicines are reviewed at length with the patient today.  The patient did not have any concerns regarding medicines.  Elinor Dodge PA-C 12/15/2017 2:46 PM     [facility] [address] [place], [place] [address] [phone]

## 2017-12-15 NOTE — Patient Instructions (Signed)
Try to continue to work to increase your activity level.

## 2017-12-15 NOTE — Patient Instructions (Addendum)
Medication Instructions: - Your physician recommends that you continue on your current medications as directed. Please refer to the Current Medication list given to you today.  Labwork: - Your physician recommends that you have lab work today: [employer]  Procedures/Testing: - none ordered  Follow-Up: - You have been referred to : the CHF clinic in [place]- for Pulmonary Hypertension That office will be in touch with you to schedule  - Your physician wants you to follow-up in: 6 months with  Dr. Mariah Milling. You will receive a reminder letter in the mail two months in advance. If you don't receive a letter, please call our office to schedule the follow-up appointment.   Any Additional Special Instructions Will Be Listed Below (If Applicable).     If you need a refill on your cardiac medications before your next appointment, please call your pharmacy.

## 2017-12-16 LAB — BASIC METABOLIC PANEL
BUN / CREAT RATIO: 33 — AB (ref 12–28)
BUN: 26 mg/dL (ref 8–27)
CO2: 22 mmol/L (ref 20–29)
CREATININE: 0.78 mg/dL (ref 0.57–1.00)
Calcium: 9.2 mg/dL (ref 8.7–10.3)
Chloride: 99 mmol/L (ref 96–106)
GFR calc non Af Amer: 72 mL/min/{1.73_m2} (ref >59)
GFR, EST AFRICAN AMERICAN: 82 mL/min/{1.73_m2} (ref >59)
GLUCOSE: 90 mg/dL (ref 65–99)
Potassium: 4.4 mmol/L (ref 3.5–5.2)
SODIUM: 139 mmol/L (ref 134–144)

## 2018-01-22 ENCOUNTER — Encounter (HOSPITAL_COMMUNITY): Payer: Medicare Other | Admitting: Cardiology

## 2018-03-01 ENCOUNTER — Other Ambulatory Visit: Payer: Self-pay

## 2018-03-01 ENCOUNTER — Encounter (HOSPITAL_COMMUNITY): Payer: Self-pay | Admitting: Cardiology

## 2018-03-01 ENCOUNTER — Encounter (HOSPITAL_COMMUNITY): Payer: Self-pay | Admitting: *Deleted

## 2018-03-01 ENCOUNTER — Other Ambulatory Visit (HOSPITAL_COMMUNITY): Payer: Self-pay | Admitting: *Deleted

## 2018-03-01 ENCOUNTER — Ambulatory Visit (HOSPITAL_COMMUNITY)
Admission: RE | Admit: 2018-03-01 | Discharge: 2018-03-01 | Disposition: A | Discharge status: Home / Self Care | Payer: Medicare Other | Source: Ambulatory Visit | Attending: Cardiology | Admitting: Cardiology

## 2018-03-01 VITALS — BP 102/60 | HR 74 | Wt 116.1 lb

## 2018-03-01 DIAGNOSIS — Z8349 Family history of other endocrine, nutritional and metabolic diseases: Secondary | ICD-10-CM | POA: Diagnosis not present

## 2018-03-01 DIAGNOSIS — Z9981 Dependence on supplemental oxygen: Secondary | ICD-10-CM | POA: Diagnosis not present

## 2018-03-01 DIAGNOSIS — J449 Chronic obstructive pulmonary disease, unspecified: Secondary | ICD-10-CM | POA: Diagnosis not present

## 2018-03-01 DIAGNOSIS — I110 Hypertensive heart disease with heart failure: Secondary | ICD-10-CM | POA: Diagnosis not present

## 2018-03-01 DIAGNOSIS — Z8781 Personal history of (healed) traumatic fracture: Secondary | ICD-10-CM | POA: Diagnosis not present

## 2018-03-01 DIAGNOSIS — I5032 Chronic diastolic (congestive) heart failure: Secondary | ICD-10-CM | POA: Diagnosis not present

## 2018-03-01 DIAGNOSIS — M069 Rheumatoid arthritis, unspecified: Secondary | ICD-10-CM | POA: Diagnosis not present

## 2018-03-01 DIAGNOSIS — E785 Hyperlipidemia, unspecified: Secondary | ICD-10-CM | POA: Insufficient documentation

## 2018-03-01 DIAGNOSIS — E039 Hypothyroidism, unspecified: Secondary | ICD-10-CM | POA: Insufficient documentation

## 2018-03-01 DIAGNOSIS — I509 Heart failure, unspecified: Secondary | ICD-10-CM | POA: Diagnosis not present

## 2018-03-01 DIAGNOSIS — Z9181 History of falling: Secondary | ICD-10-CM | POA: Diagnosis not present

## 2018-03-01 DIAGNOSIS — Z79890 Hormone replacement therapy (postmenopausal): Secondary | ICD-10-CM | POA: Insufficient documentation

## 2018-03-01 DIAGNOSIS — Z79899 Other long term (current) drug therapy: Secondary | ICD-10-CM | POA: Diagnosis not present

## 2018-03-01 DIAGNOSIS — Z8249 Family history of ischemic heart disease and other diseases of the circulatory system: Secondary | ICD-10-CM | POA: Diagnosis not present

## 2018-03-01 DIAGNOSIS — J8410 Pulmonary fibrosis, unspecified: Secondary | ICD-10-CM | POA: Diagnosis not present

## 2018-03-01 DIAGNOSIS — Z87891 Personal history of nicotine dependence: Secondary | ICD-10-CM | POA: Diagnosis not present

## 2018-03-01 DIAGNOSIS — I272 Pulmonary hypertension, unspecified: Secondary | ICD-10-CM | POA: Insufficient documentation

## 2018-03-01 DIAGNOSIS — J9611 Chronic respiratory failure with hypoxia: Secondary | ICD-10-CM | POA: Insufficient documentation

## 2018-03-01 NOTE — Patient Instructions (Signed)
Labs today (will call for abnormal results, otherwise no news is good news)  Pulmonary Function Test and Chest CT has been ordered for you, we'll schedule at checkout.  Right Heart Catheterization has been ordered for you, please see attached instruction sheet.  Follow up with Dr. Shirlee Latch 6 weeks

## 2018-03-01 NOTE — Progress Notes (Signed)
PCP: Dr. Birder Robson Cardiology: Dr. Mariah Milling HF Cardiology: Dr. Shirlee Latch  83 yo with history of chronic hypoxemic respiratory failure, chronic diastolic CHF, and pulmonary hypertension was referred by Eula Listen for evaluation of pulmonary hypertension.  She is frail and has frequent falls.  These seem mechanical, she denies lightheadedness/syncope. She fractured her hip in 5/19, had repair. She had to be diuresed in 5/19 and was started on Lasix.  Echo in 5/19 showed normal EF but severe pulmonary hypertension.  She is now using 4-5 L/min home oxygen, has been on oxygen for 2 years.  She has been said to have COPD but has not smoked for about 40 years now.  In the past, she was seen at [facility] and apparently this is where sildenafil was started.    Currently, she is mostly using a wheelchair.  Repaired left hip is still not very stable.  She does walk some with PT.  She is short of breath with walking, ok at rest. No orthopnea/PND.  No chest pain. Recently, her PCP told her that she has rheumatoid arthritis.   ECG (personally reviewed, 7/19): NSR, artifact.   Labs (7/19): K 4.9, creatinine 0.78  PMH: 1. HTN 2. Hyperlipidemia 3. Hypothyroidism 4. H/o falls, hip fracture in 5/19.  5. Chronic diastolic CHF:  - Echo (5/19): EF 60-65%, normal RV size and systolic function, mild-moderate TR with PASP 82 mmHg.  6. Chronic hypoxemic respiratory failure: On home oxygen, ?COPD.  7. Rheumatoid arthritis 8. Pulmonary hypertension.   Social history: She lives in a retirement community in [place], 2 children. She quit smoking at around age 53.  No ETOH.   Family history: Daughter with cystic fibrosis, father with MI at young age.  Mother with CHF but died in her 70s.   ROS: All systems reviewed and negative except as per HPI.   Current Outpatient Medications  Medication Sig Dispense Refill  . acetaminophen (TYLENOL) 325 MG tablet Take 650 mg by mouth every 6 (six) hours as needed.     . ALPRAZolam (XANAX)  0.25 MG tablet Take 1 tablet (0.25 mg total) by mouth 2 (two) times daily as needed for anxiety. 10 tablet 0  . amLODipine (NORVASC) 5 MG tablet Take 5 mg by mouth daily.     . furosemide (LASIX) 40 MG tablet Take 1 tablet (40 mg total) by mouth daily. 30 tablet 0  . ipratropium-albuterol (DUONEB) 0.5-2.5 (3) MG/3ML SOLN Take 3 mLs by nebulization every 6 (six) hours as needed. 360 mL 0  . levothyroxine (SYNTHROID, LEVOTHROID) 100 MCG tablet Take 100 mcg by mouth daily before breakfast.    . lisinopril (PRINIVIL,ZESTRIL) 40 MG tablet Take 40 mg by mouth daily.     [name] oxyCODONE (OXY IR/ROXICODONE) 5 MG immediate release tablet Take 1 tablet (5 mg total) by mouth every 6 (six) hours as needed for moderate pain (pain score 4-6). 20 tablet 0  . potassium chloride (KLOR-CON) 20 MEQ packet Take 10 mEq by mouth daily. 30 tablet 0  . propranolol (INDERAL) 20 MG tablet 1 TAB EVERY 12 HOURS    . ranitidine (ZANTAC) 150 MG tablet Take 150 mg by mouth 2 (two) times daily.     . sertraline (ZOLOFT) 100 MG tablet Take 100 mg by mouth daily.     . sildenafil (REVATIO) 20 MG tablet Take 20 mg by mouth 2 (two) times daily.     . simvastatin (ZOCOR) 20 MG tablet TAKE 1 TABLET BY MOUTH AT BEDTIME  No current facility-administered medications for this encounter.    BP 102/60   Pulse 74   Wt 52.7 kg (116 lb 2 oz)   SpO2 93% Comment: on 3L of O2  BMI 19.32 kg/m  General: NAD, frail Neck: No JVD, no thyromegaly or thyroid nodule.  Lungs: Dry crackles at bases bilaterally.  CV: Nondisplaced PMI.  Heart regular S1/S2, no S3/S4, no murmur.  No peripheral edema.  No carotid bruit.  Normal pedal pulses.  Abdomen: Soft, nontender, no hepatosplenomegaly, no distention.  Skin: Intact without lesions or rashes.  Neurologic: Alert and oriented x 3.  Psych: Normal affect. Extremities: No clubbing or cyanosis.  HEENT: Normal.  MSK: Hands with ulnar deviation  Assessment/Plan: 1. Chronic hypoxemic respiratory  failure: Uses 4-5 L/min home oxygen now.  Cause of this is uncertain. No recent lung imaging or PFTs.  Hard to invoke COPD as the cause of oxygen requirement, she has not smoked in 40 years.  It is possible that primary pulmonary hypertension could cause the hypoxemia.  However, given recent diagnosis of rheumatoid arthritis, I am concerned that she may have pulmonary fibrosis related to RA.  - She needs PFTs, I will arrange.  - I think she needs lung imaging.  Will order high resolution CT chest to assess for ILD.   2. Pulmonary hypertension: She had a remote right heart cath at [facility] that apparently showed pulmonary hypertension.  She was started on sildenafil, now only taking 20 mg tid.  She is not able to do a 6 minute walk hip instability.  She may have group 3 pulmonary hypertension due to intrinsic lung disease.  However, group 1 PH related to rheumatoid arthritis remains a possibility .  - I think we need to define the degree of pulmonary hypertension.  I will arrange for RHC.  We discussed risks/benefits and she agrees to proceed.  - Will send serologic workup with ANA, anti-centromere Ab, RF, CCP Ab, anti-SCL70 - If PFTs and high resolution CT do not suggest a lot of intrinsic lung disease and there is significant PAH by RHC, will consider more aggressive PH treatment.  - Increase sildenafil to 20 mg tid from bid.  3. Rheumatoid arthritis: She has been referred to a rheumatologist.   Marca Ancona 03/01/2018

## 2018-03-02 LAB — ANTI-SCLERODERMA ANTIBODY

## 2018-03-02 LAB — CYCLIC CITRUL PEPTIDE ANTIBODY, IGG/IGA

## 2018-03-02 LAB — RHEUMATOID FACTOR: Rhuematoid fact SerPl-aCnc: 406.4 IU/mL — ABNORMAL HIGH (ref 0.0–13.9)

## 2018-03-02 LAB — ANA: ANA: NEGATIVE

## 2018-03-02 LAB — CENTROMERE ANTIBODIES: Centromere Ab Screen: <0.2 AI (ref 0.0–0.9)

## 2018-03-08 ENCOUNTER — Encounter (HOSPITAL_COMMUNITY)
Admission: RE | Disposition: A | Discharge status: Home / Self Care | Payer: Self-pay | Source: Ambulatory Visit | Attending: Cardiology

## 2018-03-08 ENCOUNTER — Ambulatory Visit (HOSPITAL_COMMUNITY)
Admission: RE | Admit: 2018-03-08 | Discharge: 2018-03-08 | Disposition: A | Discharge status: Home / Self Care | Payer: Medicare Other | Source: Ambulatory Visit | Attending: Cardiology | Admitting: Cardiology

## 2018-03-08 DIAGNOSIS — M069 Rheumatoid arthritis, unspecified: Secondary | ICD-10-CM | POA: Insufficient documentation

## 2018-03-08 DIAGNOSIS — I272 Pulmonary hypertension, unspecified: Secondary | ICD-10-CM

## 2018-03-08 DIAGNOSIS — I2721 Secondary pulmonary arterial hypertension: Secondary | ICD-10-CM | POA: Diagnosis not present

## 2018-03-08 HISTORY — PX: RIGHT HEART CATH: CATH118263

## 2018-03-08 LAB — POCT I-STAT 3, VENOUS BLOOD GAS (G3P V)
ACID-BASE EXCESS: 1 mmol/L (ref 0.0–2.0)
BICARBONATE: 26.2 mmol/L (ref 20.0–28.0)
O2 SAT: 61 %
PO2 VEN: 33 mmHg (ref 32.0–45.0)
TCO2: 28 mmol/L (ref 22–32)
pCO2, Ven: 45 mmHg (ref 44.0–60.0)
pH, Ven: 7.374 (ref 7.250–7.430)

## 2018-03-08 LAB — PROTIME-INR
INR: 0.97
PROTHROMBIN TIME: 12.8 s (ref 11.4–15.2)

## 2018-03-08 LAB — CBC
HEMATOCRIT: 42.7 % (ref 36.0–46.0)
Hemoglobin: 13 g/dL (ref 12.0–15.0)
MCH: 28.4 pg (ref 26.0–34.0)
MCHC: 30.4 g/dL (ref 30.0–36.0)
MCV: 93.2 fL (ref 78.0–100.0)
Platelets: 240 10*3/uL (ref 150–400)
RBC: 4.58 MIL/uL (ref 3.87–5.11)
RDW: 16.3 % — ABNORMAL HIGH (ref 11.5–15.5)
WBC: 10 10*3/uL (ref 4.0–10.5)

## 2018-03-08 LAB — BASIC METABOLIC PANEL
ANION GAP: 10 (ref 5–15)
BUN: 29 mg/dL — ABNORMAL HIGH (ref 8–23)
CHLORIDE: 103 mmol/L (ref 98–111)
CO2: 26 mmol/L (ref 22–32)
Calcium: 9.7 mg/dL (ref 8.9–10.3)
Creatinine, Ser: 0.72 mg/dL (ref 0.44–1.00)
GFR calc non Af Amer: >60 mL/min (ref >60)
GLUCOSE: 105 mg/dL — AB (ref 70–99)
POTASSIUM: 4.2 mmol/L (ref 3.5–5.1)
Sodium: 139 mmol/L (ref 135–145)

## 2018-03-08 SURGERY — RIGHT HEART CATH
Anesthesia: LOCAL

## 2018-03-08 MED ORDER — SODIUM CHLORIDE 0.9% FLUSH: 3.0000 mL | Freq: Two times a day (BID) | INTRAVENOUS | Status: DC

## 2018-03-08 MED ORDER — LIDOCAINE HCL (PF) 1 % IJ SOLN
INTRAMUSCULAR | Status: DC | PRN
  Administered 2018-03-08: 2 mL

## 2018-03-08 MED ORDER — SODIUM CHLORIDE 0.9% FLUSH: 3.0000 mL | INTRAVENOUS | Status: DC | PRN

## 2018-03-08 MED ORDER — SODIUM CHLORIDE 0.9 % IV SOLN: INTRAVENOUS | Status: DC

## 2018-03-08 MED ORDER — HEPARIN (PORCINE) IN NACL 1000-0.9 UT/500ML-% IV SOLN
INTRAVENOUS | Status: DC | PRN
  Administered 2018-03-08: 500 mL

## 2018-03-08 MED ORDER — ONDANSETRON HCL 4 MG/2ML IJ SOLN: 4.0000 mg | Freq: Four times a day (QID) | INTRAMUSCULAR | Status: DC | PRN

## 2018-03-08 MED ORDER — SODIUM CHLORIDE 0.9 % IV SOLN: 250.0000 mL | INTRAVENOUS | Status: DC | PRN

## 2018-03-08 MED ORDER — HEPARIN (PORCINE) IN NACL 1000-0.9 UT/500ML-% IV SOLN
INTRAVENOUS | Status: AC
  Filled 2018-03-08: qty 500

## 2018-03-08 MED ORDER — ACETAMINOPHEN 325 MG PO TABS: 650.0000 mg | ORAL_TABLET | ORAL | Status: DC | PRN

## 2018-03-08 MED ORDER — LIDOCAINE HCL (PF) 1 % IJ SOLN
INTRAMUSCULAR | Status: AC
  Filled 2018-03-08: qty 30

## 2018-03-08 MED ORDER — FUROSEMIDE 40 MG PO TABS: 20.0000 mg | ORAL_TABLET | Freq: Every day | ORAL | 0 refills | Status: AC

## 2018-03-08 MED ORDER — ASPIRIN 81 MG PO CHEW: 81.0000 mg | CHEWABLE_TABLET | ORAL | Status: DC

## 2018-03-08 MED ORDER — FENTANYL CITRATE (PF) 100 MCG/2ML IJ SOLN
INTRAMUSCULAR | Status: DC | PRN
  Administered 2018-03-08: 25 ug via INTRAVENOUS

## 2018-03-08 MED ORDER — FENTANYL CITRATE (PF) 100 MCG/2ML IJ SOLN
INTRAMUSCULAR | Status: AC
  Filled 2018-03-08: qty 2

## 2018-03-08 SURGICAL SUPPLY — 7 items
CATH BALLN WEDGE 5F 110CM (CATHETERS) ×2 IMPLANT
GUIDEWIRE .025 260CM (WIRE) ×2 IMPLANT
PACK CARDIAC CATHETERIZATION (CUSTOM PROCEDURE TRAY) ×2 IMPLANT
SHEATH GLIDE SLENDER 4/5FR (SHEATH) ×2 IMPLANT
TRANSDUCER W/STOPCOCK (MISCELLANEOUS) ×2 IMPLANT
TUBING ART PRESS 72  MALE/FEM (TUBING) ×1
TUBING ART PRESS 72 MALE/FEM (TUBING) ×1 IMPLANT

## 2018-03-08 NOTE — Discharge Instructions (Signed)
Brachial Site Care Refer to this sheet in the next few weeks. These instructions provide you with information about caring for yourself after your procedure. Your health care provider may also give you more specific instructions. Your treatment has been planned according to current medical practices, but problems sometimes occur. Call your health care provider if you have any problems or questions after your procedure. What can I expect after the procedure? After your procedure, it is typical to have the following:  Bruising at the brachial site that usually fades within 1-2 weeks.  Blood collecting in the tissue (hematoma) that may be painful to the touch. It should usually decrease in size and tenderness within 1-2 weeks.  Follow these instructions at home:  Take medicines only as directed by your health care provider.  You may shower 24-48 hours after the procedure or as directed by your health care provider. Remove the bandage (dressing) and gently wash the site with plain soap and water. Pat the area dry with a clean towel. Do not rub the site, because this may cause bleeding.  Do not take baths, swim, or use a hot tub until your health care provider approves.  Check your insertion site every day for redness, swelling, or drainage.  Do not apply powder or lotion to the site.  Do not flex or bend the affected arm for 24 hours or as directed by your health care provider.  Do not push or pull heavy objects with the affected arm for 24 hours or as directed by your health care provider.  Do not lift over 10 lb (4.5 kg) for 5 days after your procedure or as directed by your health care provider.  Ask your health care provider when it is okay to: ? Return to work or school. ? Resume usual physical activities or sports. ? Resume sexual activity.  Do not drive home if you are discharged the same day as the procedure. Have someone else drive you.  You may drive 24 hours after the  procedure unless otherwise instructed by your health care provider.  Do not operate machinery or power tools for 24 hours after the procedure.  If your procedure was done as an outpatient procedure, which means that you went home the same day as your procedure, a responsible adult should be with you for the first 24 hours after you arrive home.  Keep all follow-up visits as directed by your health care provider. This is important. Contact a health care provider if:  You have a fever.  You have chills.  You have increased bleeding from the Brachial site. Hold pressure on the site. Get help right away if:  You have unusual pain at the brachial site.  You have redness, warmth, or swelling at the brachial site.  You have drainage (other than a small amount of blood on the dressing) from the brachial site.  The brachial site is bleeding, and the bleeding does not stop after 30 minutes of holding steady pressure on the site.  Your arm or hand becomes pale, cool, tingly, or numb. This information is not intended to replace advice given to you by your health care provider. Make sure you discuss any questions you have with your health care provider. Document Released: 07/05/2010 Document Revised: 11/08/2015 Document Reviewed: 12/19/2013 Elsevier Interactive Patient Education  2018 Elsevier Inc.    Moderate Conscious Sedation, Adult, Care After These instructions provide you with information about caring for yourself after your procedure. Your health  care provider may also give you more specific instructions. Your treatment has been planned according to current medical practices, but problems sometimes occur. Call your health care provider if you have any problems or questions after your procedure. What can I expect after the procedure? After your procedure, it is common:  To feel sleepy for several hours.  To feel clumsy and have poor balance for several hours.  To have poor judgment  for several hours.  To vomit if you eat too soon.  Follow these instructions at home: For at least 24 hours after the procedure:   Do not: ? Participate in activities where you could fall or become injured. ? Drive. ? Use heavy machinery. ? Drink alcohol. ? Take sleeping pills or medicines that cause drowsiness. ? Make important decisions or sign legal documents. ? Take care of children on your own.  Rest. Eating and drinking  Follow the diet recommended by your health care provider.  If you vomit: ? Drink water, juice, or soup when you can drink without vomiting. ? Make sure you have little or no nausea before eating solid foods. General instructions  Have a responsible adult stay with you until you are awake and alert.  Take over-the-counter and prescription medicines only as told by your health care provider.  If you smoke, do not smoke without supervision.  Keep all follow-up visits as told by your health care provider. This is important. Contact a health care provider if:  You keep feeling nauseous or you keep vomiting.  You feel light-headed.  You develop a rash.  You have a fever. Get help right away if:  You have trouble breathing. This information is not intended to replace advice given to you by your health care provider. Make sure you discuss any questions you have with your health care provider. Document Released: 03/23/2013 Document Revised: 11/05/2015 Document Reviewed: 09/22/2015 Elsevier Interactive Patient Education  [employer].

## 2018-03-08 NOTE — Progress Notes (Signed)
Spoke with Dr Shirlee Latch and ok to d/c one hour post procedure

## 2018-03-09 ENCOUNTER — Encounter (HOSPITAL_COMMUNITY): Payer: Self-pay | Admitting: Cardiology

## 2018-03-22 ENCOUNTER — Encounter (HOSPITAL_COMMUNITY): Payer: Medicare Other

## 2018-03-22 ENCOUNTER — Ambulatory Visit (HOSPITAL_COMMUNITY): Payer: Medicare Other

## 2018-04-12 ENCOUNTER — Ambulatory Visit (HOSPITAL_COMMUNITY)
Admission: RE | Admit: 2018-04-12 | Discharge: 2018-04-12 | Disposition: A | Discharge status: Home / Self Care | Payer: Medicare Other | Source: Ambulatory Visit | Attending: Cardiology | Admitting: Cardiology

## 2018-04-12 DIAGNOSIS — J432 Centrilobular emphysema: Secondary | ICD-10-CM | POA: Insufficient documentation

## 2018-04-12 DIAGNOSIS — I700 Atherosclerosis of aorta: Secondary | ICD-10-CM | POA: Diagnosis not present

## 2018-04-12 DIAGNOSIS — J8410 Pulmonary fibrosis, unspecified: Secondary | ICD-10-CM | POA: Diagnosis not present

## 2018-04-12 DIAGNOSIS — J479 Bronchiectasis, uncomplicated: Secondary | ICD-10-CM | POA: Insufficient documentation

## 2018-04-12 DIAGNOSIS — I272 Pulmonary hypertension, unspecified: Secondary | ICD-10-CM

## 2018-04-12 LAB — PULMONARY FUNCTION TEST
DL/VA % PRED: 25 %
DL/VA: 1.25 ml/min/mmHg/L
DLCO UNC % PRED: 17 %
DLCO UNC: 4.39 ml/min/mmHg
FEF 25-75 Pre: 0.5 L/sec
FEF2575-%Pred-Pre: 35 %
FEV1-%Pred-Pre: 67 %
FEV1-Pre: 1.36 L
FEV1FVC-%Pred-Pre: 73 %
FEV6-%PRED-PRE: 91 %
FEV6-Pre: 2.32 L
FEV6FVC-%PRED-PRE: 98 %
FVC-%PRED-PRE: 92 %
FVC-PRE: 2.49 L
PRE FEV6/FVC RATIO: 93 %
Pre FEV1/FVC ratio: 54 %
RV % PRED: 103 %
RV: 2.54 L
TLC % pred: 99 %
TLC: 5.19 L

## 2018-04-12 MED ORDER — ALBUTEROL SULFATE (2.5 MG/3ML) 0.083% IN NEBU: 2.5000 mg | INHALATION_SOLUTION | Freq: Once | RESPIRATORY_TRACT | Status: DC

## 2018-04-14 ENCOUNTER — Telehealth (HOSPITAL_COMMUNITY): Payer: Self-pay

## 2018-04-14 NOTE — Telephone Encounter (Signed)
Talked to pt POA and was given results of CT chest Emphysema, no ILD, POA verbalized understanding

## 2018-04-19 ENCOUNTER — Encounter (HOSPITAL_COMMUNITY): Payer: Self-pay | Admitting: Cardiology

## 2018-04-19 ENCOUNTER — Ambulatory Visit (HOSPITAL_COMMUNITY)
Admission: RE | Admit: 2018-04-19 | Discharge: 2018-04-19 | Disposition: A | Discharge status: Home / Self Care | Payer: Medicare Other | Source: Ambulatory Visit | Attending: Cardiology | Admitting: Cardiology

## 2018-04-19 VITALS — BP 138/80 | HR 75 | Wt 164.0 lb

## 2018-04-19 DIAGNOSIS — E039 Hypothyroidism, unspecified: Secondary | ICD-10-CM | POA: Diagnosis not present

## 2018-04-19 DIAGNOSIS — E785 Hyperlipidemia, unspecified: Secondary | ICD-10-CM | POA: Insufficient documentation

## 2018-04-19 DIAGNOSIS — I5030 Unspecified diastolic (congestive) heart failure: Secondary | ICD-10-CM | POA: Insufficient documentation

## 2018-04-19 DIAGNOSIS — Z79899 Other long term (current) drug therapy: Secondary | ICD-10-CM | POA: Insufficient documentation

## 2018-04-19 DIAGNOSIS — I272 Pulmonary hypertension, unspecified: Secondary | ICD-10-CM | POA: Diagnosis not present

## 2018-04-19 DIAGNOSIS — M069 Rheumatoid arthritis, unspecified: Secondary | ICD-10-CM | POA: Diagnosis not present

## 2018-04-19 DIAGNOSIS — J9611 Chronic respiratory failure with hypoxia: Secondary | ICD-10-CM | POA: Diagnosis not present

## 2018-04-19 DIAGNOSIS — I110 Hypertensive heart disease with heart failure: Secondary | ICD-10-CM | POA: Diagnosis not present

## 2018-04-19 DIAGNOSIS — Z79890 Hormone replacement therapy (postmenopausal): Secondary | ICD-10-CM | POA: Diagnosis not present

## 2018-04-19 MED ORDER — MACITENTAN 10 MG PO TABS: 10.0000 mg | ORAL_TABLET | Freq: Every day | ORAL | Status: AC

## 2018-04-19 MED ORDER — SILDENAFIL CITRATE 20 MG PO TABS: 20.0000 mg | ORAL_TABLET | Freq: Three times a day (TID) | ORAL | 3 refills | Status: AC

## 2018-04-19 NOTE — Patient Instructions (Signed)
INCREASE Sildenafil to 20mg  three times daily.  START Opsumit 10mg  daily. (Mail order pharmacy will contact you about scheduling delivery)  Follow up in 2 months.

## 2018-04-19 NOTE — Progress Notes (Signed)
PCP: Dr. Birder Robson Cardiology: Dr. Mariah Milling HF Cardiology: Dr. Shirlee Latch  83 yo with history of chronic hypoxemic respiratory failure, chronic diastolic CHF, and pulmonary hypertension was referred by Eula Listen for evaluation of pulmonary hypertension.  She is frail and has frequent falls.  These seem mechanical, she denies lightheadedness/syncope. She fractured her hip in 5/19, had repair. She had to be diuresed in 5/19 and was started on Lasix.  Echo in 5/19 showed normal EF but severe pulmonary hypertension.  She is now using 4-5 L/min home oxygen, has been on oxygen for 2 years.  She has been said to have COPD but has not smoked for about 40 years now.  In the past, she was seen at [facility] and apparently this is where sildenafil was started.    She was recently diagnosed with rheumatoid arthritis.    She had RHC in 9/19, showed mild pulmonary hypertension but this was in the setting of very low CVP and PCWP, leading to high PVR.  PFTs showed moderate obstruction in 10/19, and CT chest showed upper lobe emphysema in 10/19.    She returns for followup of pulmonary hypertension.   Currently, she is mostly using a wheelchair.  Repaired left hip is still not very stable.  She does walk some with PT.  She is not short of breath at rest, gets mildly dyspneic with transfers.  No chest pain. No dizziness/lightheadedness.  No palpitations.    Labs (7/19): K 4.9, creatinine 0.78 Labs (9/19): K 4.2, creatinine 0.72  PMH: 1. HTN 2. Hyperlipidemia 3. Hypothyroidism 4. H/o falls, hip fracture in 5/19.  5. Chronic diastolic CHF:  - Echo (5/19): EF 60-65%, normal RV size and systolic function, mild-moderate TR with PASP 82 mmHg.  6. Chronic hypoxemic respiratory failure: On home oxygen, ?COPD.  7. Rheumatoid arthritis 8. Pulmonary hypertension.  - RHC (9/19): mean RA 1, PA 40/20 mean 29, mean PCWP 3, CI 2.03, PVR 8.2 WU.  - PFTs (10/19): FVC 92%, FEV1 67%, FEV1/FVC ratio 73%, DLCO 17%, TLC 97%.  - CT chest  (10/19): Upper lobe emphysema.  - CCP > 250, RF 406, SCL 70 negative, ANA negative, anti-centromere Ab negative.   Social history: She lives in a retirement community in [place], 2 children. She quit smoking at around age 20.  No ETOH.   Family history: Daughter with cystic fibrosis, father with MI at young age.  Mother with CHF but died in her 9s.   ROS: All systems reviewed and negative except as per HPI.   Current Outpatient Medications  Medication Sig Dispense Refill  . acetaminophen (TYLENOL) 325 MG tablet Take 650 mg by mouth every 6 (six) hours as needed for moderate pain or headache.     . ALPRAZolam (XANAX) 0.25 MG tablet Take 1 tablet (0.25 mg total) by mouth 2 (two) times daily as needed for anxiety. 10 tablet 0  . amLODipine (NORVASC) 5 MG tablet Take 5 mg by mouth daily.     . furosemide (LASIX) 40 MG tablet Take 0.5 tablets (20 mg total) by mouth daily. 30 tablet 0  . ipratropium-albuterol (DUONEB) 0.5-2.5 (3) MG/3ML SOLN Take 3 mLs by nebulization every 6 (six) hours as needed. 360 mL 0  . levothyroxine (SYNTHROID, LEVOTHROID) 100 MCG tablet Take 100 mcg by mouth daily before breakfast.    . lisinopril (PRINIVIL,ZESTRIL) 40 MG tablet Take 40 mg by mouth daily.     . meloxicam (MOBIC) 15 MG tablet Take 15 mg by mouth daily.    [name]  potassium chloride (KLOR-CON) 20 MEQ packet Take 10 mEq by mouth daily. 30 tablet 0  . propranolol (INDERAL) 20 MG tablet Take 20 mg by mouth 2 (two) times daily.     . ranitidine (ZANTAC) 150 MG tablet Take 150 mg by mouth 2 (two) times daily.     . sertraline (ZOLOFT) 100 MG tablet Take 100 mg by mouth daily.     . sildenafil (REVATIO) 20 MG tablet Take 1 tablet (20 mg total) by mouth 3 (three) times daily. 90 tablet 3  . simvastatin (ZOCOR) 20 MG tablet Take 20 mg by mouth at bedtime.     . macitentan (OPSUMIT) 10 MG tablet Take 1 tablet (10 mg total) by mouth daily. 30 tablet    No current facility-administered medications for this encounter.     BP 138/80   Pulse 75   Wt 74.4 kg (164 lb)   SpO2 91%   BMI 27.29 kg/m  General: Frail, in wheelchair.  Neck: No JVD, no thyromegaly or thyroid nodule.  Lungs: Clear to auscultation bilaterally with normal respiratory effort. CV: Nondisplaced PMI.  Heart regular S1/S2, no S3/S4, no murmur.  No peripheral edema.  No carotid bruit.  Normal pedal pulses.  Abdomen: Soft, nontender, no hepatosplenomegaly, no distention.  Skin: Intact without lesions or rashes.  Neurologic: Alert and oriented x 3.  Psych: Normal affect. Extremities: No clubbing or cyanosis.  HEENT: Normal.   Assessment/Plan: 1. Chronic hypoxemic respiratory failure: Uses 4-5 L/min home oxygen now.  She has moderate COPD by PFTs, upper lobe emphysema on CT chest.  It is possible that group 1 pulmonary hypertension related to RA may contribute.  2. Pulmonary hypertension: She had a remote right heart cath at [facility] that apparently showed pulmonary hypertension.  She was started on sildenafil, now only taking 20 mg once daily.  She is not able to do a 6 minute walk due to hip instability.  RHC in 9/19 showed low filling pressures with PVR high at 8.2, suggestive of pulmonary arterial HTN.   I suspect mixed group 1 (possibly from RA) and group 3 pulmonary hypertension (from COPD).  I think PA pressure elevation is out of proportion to degree of COPD (moderate).    - Increase sildenafil to 20 mg tid.  - Add Opsumit 10 mg daily.  - She will need a V/Q scan to rule out chronic PE.  - With low filling pressures on RHC, Lasix was decreased to 20 mg daily.  3. Rheumatoid arthritis: She has been referred to a rheumatologist.   Marca Ancona 04/19/2018

## 2018-04-20 ENCOUNTER — Telehealth (HOSPITAL_COMMUNITY): Payer: Self-pay | Admitting: *Deleted

## 2018-04-20 NOTE — Telephone Encounter (Signed)
Paperwork faxed to [facility] for opsumit.

## 2018-07-07 ENCOUNTER — Encounter (HOSPITAL_COMMUNITY): Payer: Medicare Other | Admitting: Cardiology

## 2019-11-04 IMAGING — CR DG CHEST 1V PORT
1 series · 1 of 1 positions shown · non-contrast
Comparison: None.

CLINICAL DATA: Fall

EXAM:
PORTABLE CHEST 1 VIEW

[chest ap]
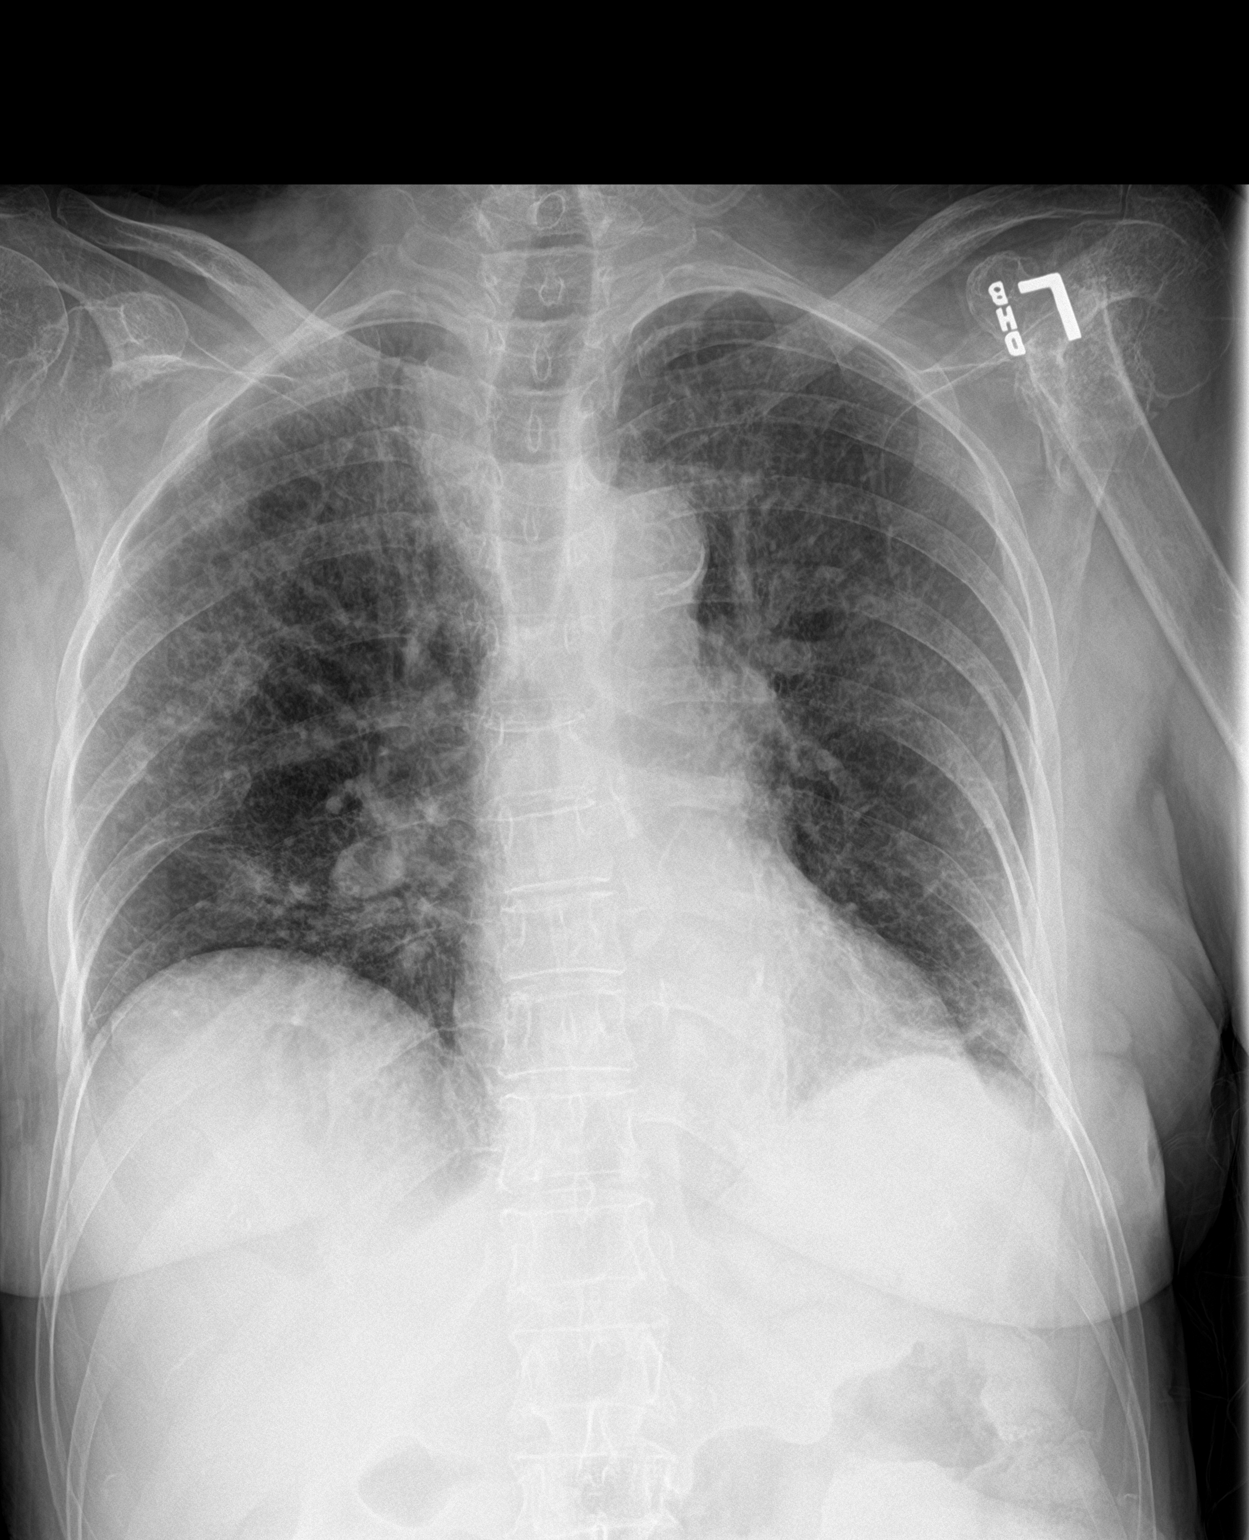

[1 of 1 positions shown; findings below may reference images not displayed]

FINDINGS: Diffuse interstitial prominence. Calcific aortic atherosclerosis. No
focal airspace consolidation. No pleural effusion or pneumothorax.
IMPRESSION: Diffuse interstitial prominence may indicate emphysema or mild/early
pulmonary edema. No radiographic evidence of pneumonia.

## 2020-04-24 IMAGING — CT CT CHEST W/O CM
2 of 3 series · 15 of 36 positions shown, 18 images · non-contrast
Comparison: None.

CLINICAL DATA: Congestive heart failure

EXAM:
CT CHEST WITHOUT CONTRAST
TECHNIQUE: Multidetector CT imaging of the chest was performed following the
standard protocol without IV contrast.

[Series 4: thorax 2.0 · axial · 0.68mm/px · z∈[+961,+1237]mm · 12 of 164 slices shown, 15 images]
[im 13/164  mediastinal]
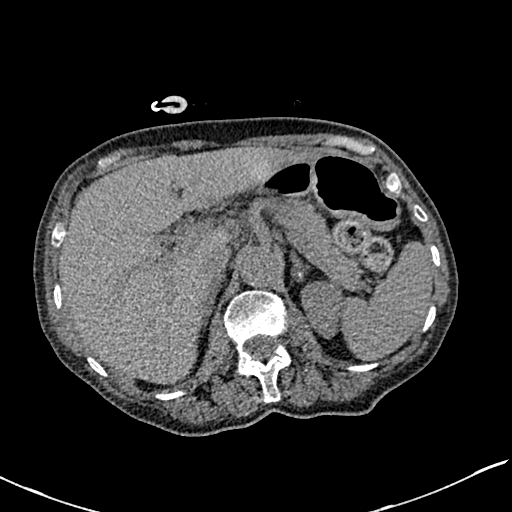
[im 13/164  lung]
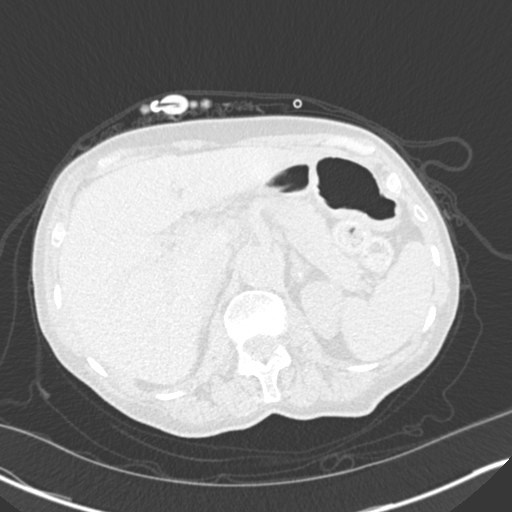
[im 25/164  lung]
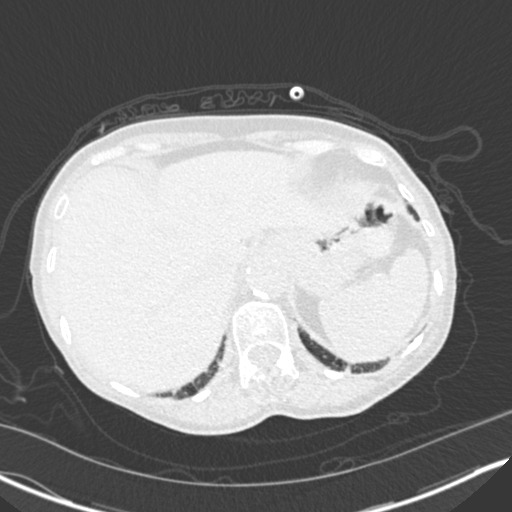
[im 37/164  lung]
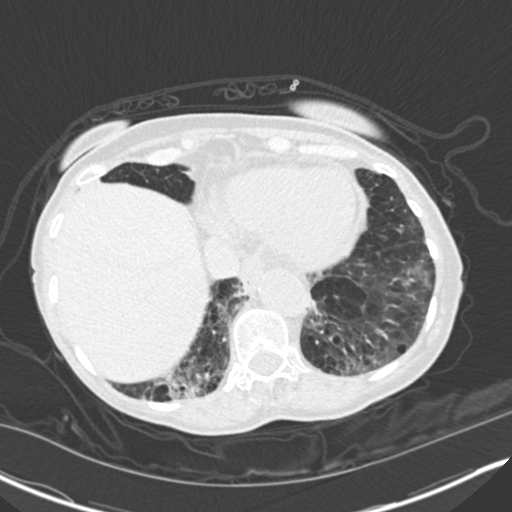
[im 49/164  lung]
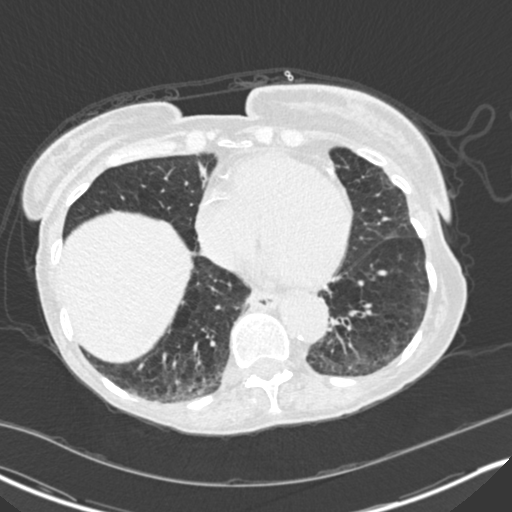
[im 61/164  mediastinal]
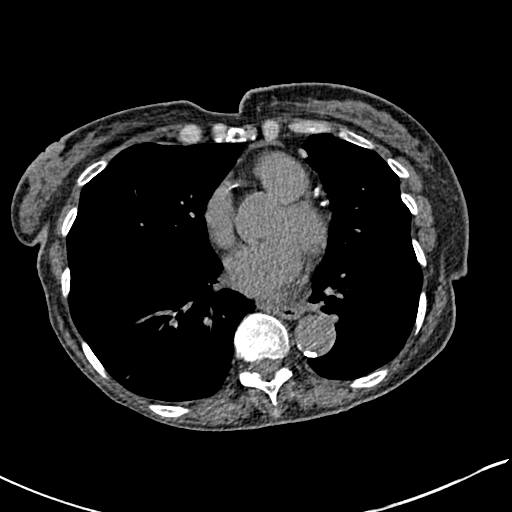
[im 61/164  lung]
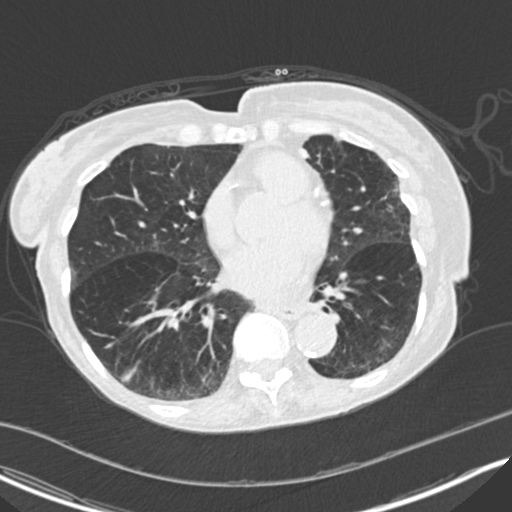
[im 73/164  lung]
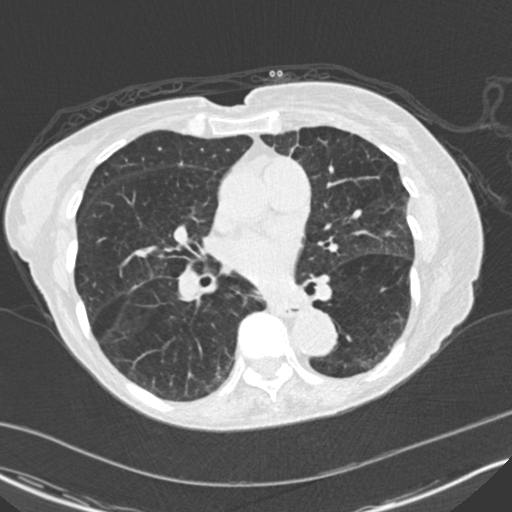
[im 91/164  lung]
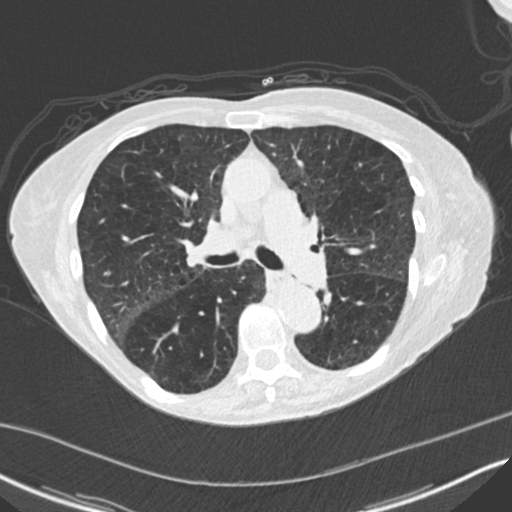
[im 103/164  lung]
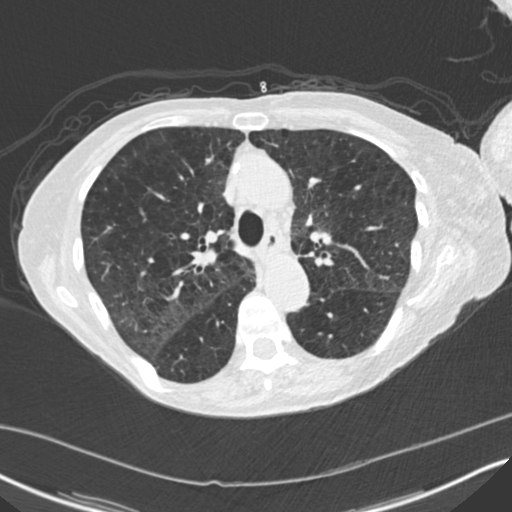
[im 115/164  mediastinal]
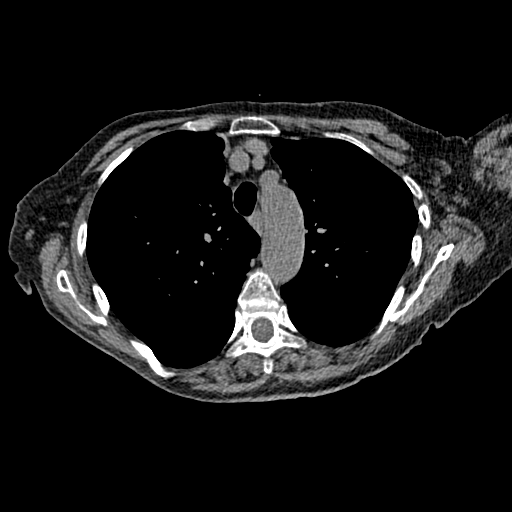
[im 115/164  lung]
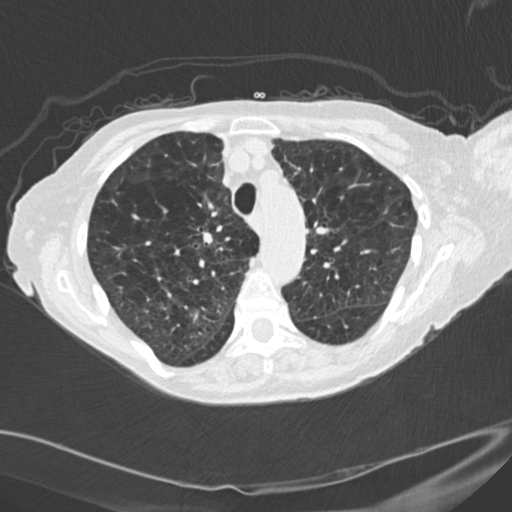
[im 127/164  lung]
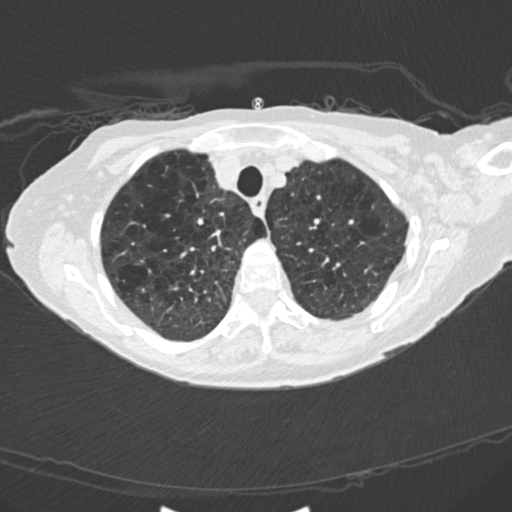
[im 139/164  lung]
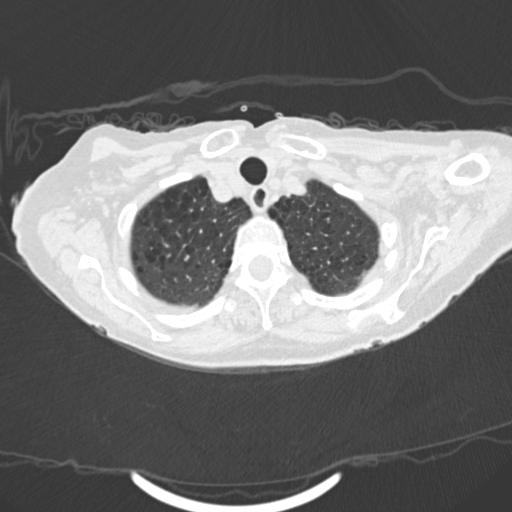
[im 151/164  lung]
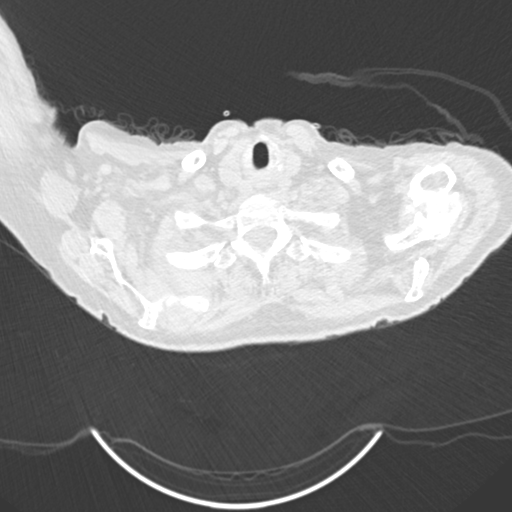

[Series 7: coronal · coronal · 0.64mm/px · 3 of 87 slices shown]
[im 18/87  lung]
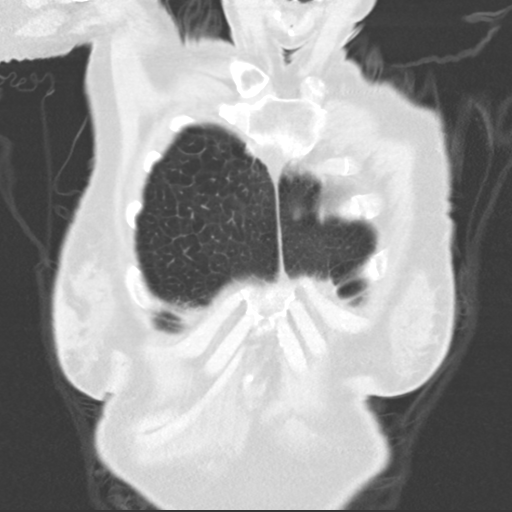
[im 35/87  lung]
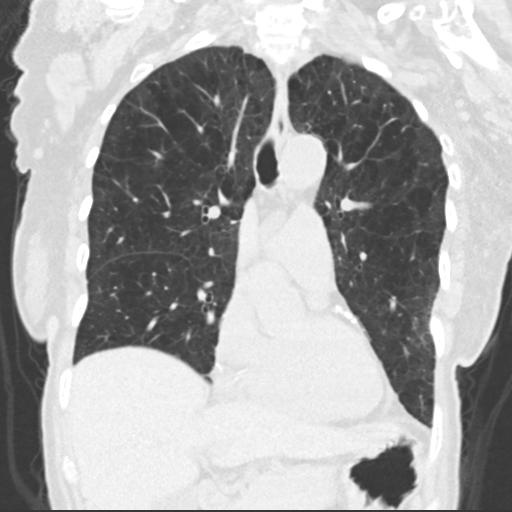
[im 52/87  lung]
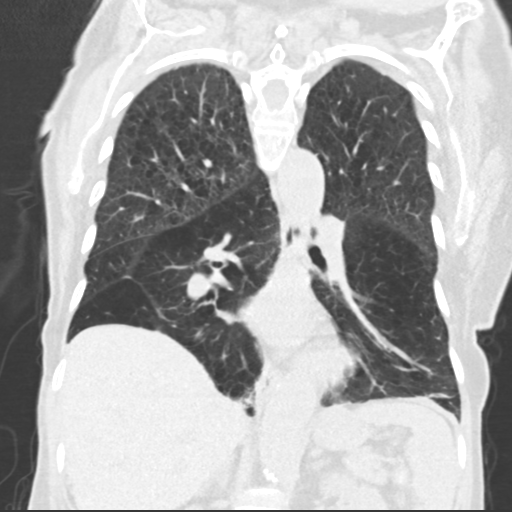

[15 of 36 positions shown; findings below may reference images not displayed]

FINDINGS: Cardiovascular: Coronary artery calcification and aortic
atherosclerotic calcification.

Mediastinum/Nodes: No axillary or supraclavicular adenopathy. No
mediastinal hilar adenopathy. No pericardial effusion. Esophagus
normal.

Lungs/Pleura: Centrilobular emphysema in the upper lobes. No
interstitial edema or pulmonary edema. No infiltrate. Mild
bronchiectasis the lung bases.

Upper Abdomen: Limited view of the liver, kidneys, pancreas are
unremarkable. Normal adrenal glands.

Musculoskeletal: No aggressive osseous lesion.
IMPRESSION: 1. No pulmonary edema, interstitial edema or pneumonia.
2. Upper lobe centrilobular emphysema.
3. Mild bronchiectasis.
4. Aortic Atherosclerosis (1NM5X-2KR.R) and Emphysema ([id number]-WFF.8).

## 2020-08-06 ENCOUNTER — Telehealth: Payer: Self-pay | Admitting: Cardiovascular Disease

## 2020-08-06 NOTE — Telephone Encounter (Signed)
Patient moved and declined recall  

## 2021-04-16 DEATH — deceased
